# Patient Record
Sex: Female | Born: 1950
Health system: Southern US, Community
[De-identification: ages and names within clinical notes are randomized; demographics above are authoritative.]

## PROBLEM LIST (undated history)

## (undated) DIAGNOSIS — Z9981 Dependence on supplemental oxygen: Secondary | ICD-10-CM

## (undated) DIAGNOSIS — I10 Essential (primary) hypertension: Secondary | ICD-10-CM

## (undated) DIAGNOSIS — J449 Chronic obstructive pulmonary disease, unspecified: Secondary | ICD-10-CM

## (undated) DIAGNOSIS — E785 Hyperlipidemia, unspecified: Secondary | ICD-10-CM

## (undated) DIAGNOSIS — R002 Palpitations: Secondary | ICD-10-CM

## (undated) HISTORY — DX: Palpitations: R00.2

## (undated) HISTORY — PX: TONSILLECTOMY: SUR1361

## (undated) HISTORY — DX: Chronic obstructive pulmonary disease, unspecified: J44.9

## (undated) HISTORY — PX: BREAST BIOPSY: SHX20

## (undated) HISTORY — DX: Hyperlipidemia, unspecified: E78.5

## (undated) HISTORY — DX: Essential (primary) hypertension: I10

---

## 2008-01-06 ENCOUNTER — Ambulatory Visit (HOSPITAL_COMMUNITY): Admission: RE | Admit: 2008-01-06 | Discharge: 2008-01-06 | Payer: Self-pay | Admitting: Internal Medicine

## 2008-01-26 ENCOUNTER — Other Ambulatory Visit: Admission: RE | Admit: 2008-01-26 | Discharge: 2008-01-26 | Payer: Self-pay | Admitting: Obstetrics and Gynecology

## 2008-02-12 ENCOUNTER — Ambulatory Visit: Payer: Self-pay | Admitting: Gastroenterology

## 2008-02-19 ENCOUNTER — Ambulatory Visit: Payer: Self-pay | Admitting: Internal Medicine

## 2008-02-19 ENCOUNTER — Ambulatory Visit (HOSPITAL_COMMUNITY): Admission: RE | Admit: 2008-02-19 | Discharge: 2008-02-19 | Payer: Self-pay | Admitting: Internal Medicine

## 2008-10-21 ENCOUNTER — Ambulatory Visit: Payer: Self-pay | Admitting: Cardiology

## 2008-10-22 ENCOUNTER — Encounter: Payer: Self-pay | Admitting: Cardiology

## 2008-10-22 ENCOUNTER — Ambulatory Visit: Payer: Self-pay | Admitting: Cardiology

## 2008-10-22 ENCOUNTER — Ambulatory Visit (HOSPITAL_COMMUNITY): Admission: RE | Admit: 2008-10-22 | Discharge: 2008-10-22 | Payer: Self-pay | Admitting: Cardiology

## 2009-01-07 ENCOUNTER — Ambulatory Visit (HOSPITAL_COMMUNITY): Admission: RE | Admit: 2009-01-07 | Discharge: 2009-01-07 | Payer: Self-pay | Admitting: Internal Medicine

## 2009-01-11 DIAGNOSIS — E663 Overweight: Secondary | ICD-10-CM | POA: Insufficient documentation

## 2009-01-11 DIAGNOSIS — R002 Palpitations: Secondary | ICD-10-CM

## 2009-01-11 DIAGNOSIS — I10 Essential (primary) hypertension: Secondary | ICD-10-CM

## 2009-01-11 DIAGNOSIS — R9431 Abnormal electrocardiogram [ECG] [EKG]: Secondary | ICD-10-CM | POA: Insufficient documentation

## 2009-07-20 ENCOUNTER — Other Ambulatory Visit: Admission: RE | Admit: 2009-07-20 | Discharge: 2009-07-20 | Payer: Self-pay | Admitting: Internal Medicine

## 2009-11-22 ENCOUNTER — Ambulatory Visit (HOSPITAL_COMMUNITY): Admission: RE | Admit: 2009-11-22 | Discharge: 2009-11-22 | Payer: Self-pay | Admitting: Internal Medicine

## 2010-04-17 ENCOUNTER — Ambulatory Visit (HOSPITAL_COMMUNITY): Admission: RE | Admit: 2010-04-17 | Discharge: 2010-04-17 | Payer: Self-pay | Admitting: Gastroenterology

## 2010-04-27 ENCOUNTER — Ambulatory Visit (HOSPITAL_COMMUNITY): Admission: RE | Admit: 2010-04-27 | Discharge: 2010-04-27 | Payer: Self-pay | Admitting: Neurology

## 2010-10-04 ENCOUNTER — Observation Stay (HOSPITAL_COMMUNITY)
Admission: EM | Admit: 2010-10-04 | Discharge: 2010-10-06 | Disposition: A | Discharge status: Home / Self Care | Payer: Self-pay | Attending: Internal Medicine | Admitting: Internal Medicine

## 2010-10-04 ENCOUNTER — Emergency Department (HOSPITAL_COMMUNITY): Payer: Self-pay

## 2010-10-04 DIAGNOSIS — Z79899 Other long term (current) drug therapy: Secondary | ICD-10-CM | POA: Insufficient documentation

## 2010-10-04 DIAGNOSIS — R0602 Shortness of breath: Secondary | ICD-10-CM | POA: Insufficient documentation

## 2010-10-04 DIAGNOSIS — I517 Cardiomegaly: Secondary | ICD-10-CM | POA: Insufficient documentation

## 2010-10-04 DIAGNOSIS — R0789 Other chest pain: Principal | ICD-10-CM | POA: Insufficient documentation

## 2010-10-04 DIAGNOSIS — E785 Hyperlipidemia, unspecified: Secondary | ICD-10-CM | POA: Insufficient documentation

## 2010-10-04 DIAGNOSIS — J441 Chronic obstructive pulmonary disease with (acute) exacerbation: Secondary | ICD-10-CM | POA: Insufficient documentation

## 2010-10-04 LAB — DIFFERENTIAL
Basophils Absolute: 0 10*3/uL (ref 0.0–0.1)
Basophils Relative: 0 % (ref 0–1)
Eosinophils Absolute: 0.1 10*3/uL (ref 0.0–0.7)
Eosinophils Relative: 1 % (ref 0–5)
Lymphocytes Relative: 25 % (ref 12–46)
Lymphs Abs: 1.4 10*3/uL (ref 0.7–4.0)
Monocytes Absolute: 0.6 10*3/uL (ref 0.1–1.0)
Monocytes Relative: 10 % (ref 3–12)
Neutro Abs: 3.6 10*3/uL (ref 1.7–7.7)
Neutrophils Relative %: 63 % (ref 43–77)

## 2010-10-04 LAB — CBC
HCT: 38.1 % (ref 36.0–46.0)
Hemoglobin: 12.6 g/dL (ref 12.0–15.0)
MCH: 28.6 pg (ref 26.0–34.0)
MCHC: 33.1 g/dL (ref 30.0–36.0)
MCV: 86.6 fL (ref 78.0–100.0)
Platelets: 224 10*3/uL (ref 150–400)
RBC: 4.4 MIL/uL (ref 3.87–5.11)
RDW: 13.5 % (ref 11.5–15.5)
WBC: 5.6 10*3/uL (ref 4.0–10.5)

## 2010-10-04 LAB — BASIC METABOLIC PANEL
BUN: 12 mg/dL (ref 6–23)
CO2: 28 mEq/L (ref 19–32)
Calcium: 9.5 mg/dL (ref 8.4–10.5)
Chloride: 107 mEq/L (ref 96–112)
Creatinine, Ser: 0.75 mg/dL (ref 0.4–1.2)
GFR calc Af Amer: >60 mL/min (ref >60)
GFR calc non Af Amer: >60 mL/min (ref >60)
Glucose, Bld: 98 mg/dL (ref 70–99)
Potassium: 3.9 mEq/L (ref 3.5–5.1)
Sodium: 142 mEq/L (ref 135–145)

## 2010-10-04 LAB — POCT CARDIAC MARKERS
CKMB, poc: <1 ng/mL — ABNORMAL LOW (ref 1.0–8.0)
Myoglobin, poc: 64.1 ng/mL (ref 12–200)
Troponin i, poc: <0.05 ng/mL (ref 0.00–0.09)

## 2010-10-04 LAB — CARDIAC PANEL(CRET KIN+CKTOT+MB+TROPI)
CK, MB: 1.6 ng/mL (ref 0.3–4.0)
Relative Index: INVALID (ref 0.0–2.5)
Total CK: 70 U/L (ref 7–177)
Troponin I: 0.01 ng/mL (ref 0.00–0.06)

## 2010-10-04 LAB — HEPATIC FUNCTION PANEL
ALT: 21 U/L (ref 0–35)
AST: 24 U/L (ref 0–37)
Albumin: 4.2 g/dL (ref 3.5–5.2)
Alkaline Phosphatase: 78 U/L (ref 39–117)
Bilirubin, Direct: 0.1 mg/dL (ref 0.0–0.3)
Indirect Bilirubin: 0.4 mg/dL (ref 0.3–0.9)
Total Bilirubin: 0.5 mg/dL (ref 0.3–1.2)
Total Protein: 7.1 g/dL (ref 6.0–8.3)

## 2010-10-04 LAB — D-DIMER, QUANTITATIVE: D-Dimer, Quant: 0.35 ug/mL-FEU (ref 0.00–0.48)

## 2010-10-04 LAB — BRAIN NATRIURETIC PEPTIDE: Pro B Natriuretic peptide (BNP): 41.5 pg/mL (ref 0.0–100.0)

## 2010-10-05 DIAGNOSIS — R079 Chest pain, unspecified: Secondary | ICD-10-CM

## 2010-10-05 DIAGNOSIS — R072 Precordial pain: Secondary | ICD-10-CM

## 2010-10-05 LAB — CARDIAC PANEL(CRET KIN+CKTOT+MB+TROPI)
CK, MB: 1.2 ng/mL (ref 0.3–4.0)
CK, MB: 1.6 ng/mL (ref 0.3–4.0)
CK, MB: 1.3 ng/mL (ref 0.3–4.0)
CK, MB: 1.2 ng/mL (ref 0.3–4.0)
Relative Index: INVALID (ref 0.0–2.5)
Relative Index: INVALID (ref 0.0–2.5)
Relative Index: INVALID (ref 0.0–2.5)
Relative Index: INVALID (ref 0.0–2.5)
Total CK: 65 U/L (ref 7–177)
Total CK: 56 U/L (ref 7–177)
Total CK: 47 U/L (ref 7–177)
Total CK: 42 U/L (ref 7–177)
Troponin I: 0.01 ng/mL (ref 0.00–0.06)
Troponin I: 0.02 ng/mL (ref 0.00–0.06)
Troponin I: 0.02 ng/mL (ref 0.00–0.06)
Troponin I: 0.01 ng/mL (ref 0.00–0.06)

## 2010-10-05 LAB — LIPID PANEL
Cholesterol: 185 mg/dL (ref 0–200)
HDL: 59 mg/dL (ref >39)
LDL Cholesterol: 106 mg/dL — ABNORMAL HIGH (ref 0–99)
Total CHOL/HDL Ratio: 3.1 RATIO
Triglycerides: 98 mg/dL (ref <150)
VLDL: 20 mg/dL (ref 0–40)

## 2010-10-05 LAB — TSH: TSH: 0.453 u[IU]/mL (ref 0.350–4.500)

## 2010-10-06 ENCOUNTER — Observation Stay (HOSPITAL_COMMUNITY): Payer: Self-pay

## 2010-10-06 ENCOUNTER — Encounter (HOSPITAL_COMMUNITY): Payer: Self-pay | Admitting: Radiology

## 2010-10-06 DIAGNOSIS — R079 Chest pain, unspecified: Secondary | ICD-10-CM

## 2010-10-06 DIAGNOSIS — R0602 Shortness of breath: Secondary | ICD-10-CM

## 2010-10-06 LAB — CARDIAC PANEL(CRET KIN+CKTOT+MB+TROPI)
CK, MB: 1.1 ng/mL (ref 0.3–4.0)
CK, MB: 1 ng/mL (ref 0.3–4.0)
Relative Index: INVALID (ref 0.0–2.5)
Relative Index: INVALID (ref 0.0–2.5)
Total CK: 38 U/L (ref 7–177)
Troponin I: 0.01 ng/mL (ref 0.00–0.06)
Troponin I: 0.01 ng/mL (ref 0.00–0.06)

## 2010-10-06 MED ORDER — TECHNETIUM TC 99M TETROFOSMIN IV KIT
10.0000 | PACK | Freq: Once | INTRAVENOUS | Status: AC | PRN
  Administered 2010-10-06: 9.31 via INTRAVENOUS

## 2010-10-06 MED ORDER — TECHNETIUM TC 99M TETROFOSMIN IV KIT
30.0000 | PACK | Freq: Once | INTRAVENOUS | Status: AC | PRN
  Administered 2010-10-06: 30 via INTRAVENOUS

## 2010-10-08 NOTE — Discharge Summary (Signed)
Sherri Reed, ROEMER NO.:  [id number]  MEDICAL RECORD NO.:  [id number]           PATIENT TYPE:  O  LOCATION:  A201                          FACILITY:  [facility]  PHYSICIAN:  Elliot Cousin, M.D.    DATE OF BIRTH:  [birth year]  DATE OF ADMISSION:  10/04/2010 DATE OF DISCHARGE:  03/23/2012LH                              DISCHARGE SUMMARY   DISCHARGE DIAGNOSES: 1. Chest pain.  Myocardial infarction ruled out.  Stress Myoview     revealed no signs of ischemia. 2. Acute on chronic dyspnea, likely secondary to chronic obstructive     pulmonary disease with mild exacerbation. 3. Mild dyslipidemia.  The patient's total cholesterol was 185,     triglycerides 98, HDL-cholesterol 59, and LDL-cholesterol 106. 4. Mild left ventricular hypertrophy by  2-D echocardiogram. 5. Possible gastroesophageal reflux disease.  DISCHARGE MEDICATIONS: 1. Ambien 10 mg daily. 2. Acetaminophen 325 mg two tablets every 4-6 hours as needed for  pain. 3. Prilosec OTC one tablet daily. 4. Spiriva 18 mcg one inhalation daily. 5. Advair Diskus 250/50 mg one inhalation twice daily. 6. Albuterol inhaler one puff every 6 hours as needed for shortness of     breath. 7. Aspirin 81 mg daily. 8. Stop naproxen.  DISCHARGE DISPOSITION:  The patient was discharged to home in improved and stable condition on October 06, 2010.  She will follow up with the physicians at the [facility] in 1 week and with [facility]monology on October 26, 2010 at 10:30 a.m.  CONSULTATIONS:  Gerrit Friends. Dietrich Pates, MD, [facility]  PROCEDURES PERFORMED: 1. Stress Myoview study on October 06, 2010.  The results revealed low     risk dobutamine Myoview as outlined.  No diagnostic ST-segment     changes noted.  Perfusion imaging shows evidence of breast     attenuation; however, no ischemic defects.  Left ventricular     ejection fraction is normal at 62%. 2. A 2-D echocardiogram on October 05, 2010.  The results revealed left  ventricular wall thickness was in the pattern of mild LVH with     disproportionate upper septal thickening.  Systolic function was     normal.  Ejection fraction estimated to be 55-60%.  Wall motion was     normal.  There were no regional wall motion abnormalities. 3. Chest x-ray on October 04, 2010.  The results revealed no acute     findings.  Lungs are clear.  HISTORY OF PRESENTING ILLNESS:  The patient is a 60 year old woman with a past medical history significant for COPD and noncardiac chest pain, who presented to the emergency department on October 04, 2010 with a chief complaint of chest pain and  shortness of breath.  When she was initially evaluated, she was noted to be hemodynamically stable and afebrile.  Her EKG revealed normal sinus rhythm with nonspecific T-wave abnormalities and a heart rate of 64 beats per minute.  Her BNP was 41.5.  Her initial cardiac markers were within normal limits.  She was admitted for further evaluation and management.  HOSPITAL COURSE:  The patient was started on supportive treatment with as-needed morphine  for pain, as-needed Zofran for nausea, and prophylactic Protonix for possible gastroesophageal reflux disease.  She was also maintained on Advair Diskus.  Albuterol and Atrovent nebulizations were ordered for every 6 hours and then every 2 hours as needed for active COPD with mild exacerbation.  Oxygen was applied to keep her oxygen saturations greater than 90%.  For further evaluation, a number of studies were ordered including cardiac enzymes, D-dimer, TSH, 2-D echocardiogram, and a fasting lipid profile.  The patient's cardiac enzymes were all well within normal limits.  Her D-dimer was within normal limits at 0.35.  Her TSH was within normal limits at 0.453.  Her fasting lipid profile results were dictated above.  Her 2-D echocardiogram revealed preserved LV function, but with mild LVH.  There were no left ventricular regional  motion abnormalities.  Cardiologist, Dr. Dietrich Pates was consulted.  Following his evaluation, he recommended further assessment with a Myoview stress test.  The stress test was performed today.  The results revealed no evidence of reversible ischemia and preserved LV function.  The patient has a history of heartburn and indigestion.  She also takes naproxen for arthritic pain.  I instructed her to discontinue naproxen and to take Tylenol Arthritis Strength as directed and as needed for pain.  I also instructed her to take Prilosec OTC one tablet daily for possible reflux symptoms.  She voiced understanding.  She was also advised to continue Advair Diskus and albuterol inhaler as previously prescribed.  She was given Spiriva prior to hospital discharge to take home.  Per the recommendation by Dr. Dietrich Pates, an appointment was made for her to follow up with one of the pulmonologist at [facility] on October 26, 2010.  The patient was informed of this.  Prior to hospital discharge, she was oxygenating 99-100% on 2 liters of oxygen.  Oxygen was discontinued upon discharge.     Elliot Cousin, M.D.     DF/MEDQ  D:  10/06/2010  T:  10/06/2010  Job:  [id number]  cc:   Gerrit Friends. Dietrich Pates, MD, [facility] [address] [place], [place] [address]  [facility] [place]  Electronically Signed by Elliot Cousin M.D. on 10/08/2010 02:06:50 PM

## 2010-10-24 ENCOUNTER — Encounter: Payer: Self-pay | Admitting: Internal Medicine

## 2010-10-26 ENCOUNTER — Ambulatory Visit (INDEPENDENT_AMBULATORY_CARE_PROVIDER_SITE_OTHER): Payer: Self-pay | Admitting: Internal Medicine

## 2010-10-26 ENCOUNTER — Encounter: Payer: Self-pay | Admitting: Internal Medicine

## 2010-10-26 VITALS — BP 142/80 | HR 63 | Temp 97.9°F | Ht <= 58 in | Wt 187.4 lb

## 2010-10-26 DIAGNOSIS — J449 Chronic obstructive pulmonary disease, unspecified: Secondary | ICD-10-CM | POA: Insufficient documentation

## 2010-10-26 MED ORDER — BUDESONIDE-FORMOTEROL FUMARATE 160-4.5 MCG/ACT IN AERO: 2.0000 | INHALATION_SPRAY | Freq: Two times a day (BID) | RESPIRATORY_TRACT | Status: DC

## 2010-10-26 NOTE — Patient Instructions (Signed)
Symbicort Take 2 puffs first thing in am and then another 2 puffs about 12 hours later and stop advair.  Follow the symbicort each with the spiriva including the day you return  GERD (REFLUX)  is an extremely common cause of respiratory symptoms, many times with no significant heartburn at all.    It can be treated with medication, but also with lifestyle changes including avoidance of late meals, excessive alcohol, smoking cessation, and avoid fatty foods, chocolate, peppermint, colas, red wine, and acidic juices such as orange juice.  NO MINT OR MENTHOL PRODUCTS SO NO COUGH DROPS  USE SUGARLESS CANDY INSTEAD (jolley ranchers or Stover's)  NO OIL BASED VITAMINS   Please schedule a follow up office visit in 4 weeks, sooner if needed with PFT's

## 2010-10-26 NOTE — Progress Notes (Signed)
  Subjective:    Patient ID: Sherri Reed, female    DOB: 10-03-1950, 60 y.o.   MRN: [id number]  HPI  23 yowf quit smoking 2007 p hosp at [facility]  with pna @ wt 140 with doe that has gradually worsened since then and referred by Dr Elliot Cousin p another hosp in March 2012 for sob and cp.   10/26/2010  Initial pulmonary office eval cc doe x room to room and totally exhausted walking to mailbox and back feels proventil works the best assoc with dry cough day > night.    Pt denies any significant sore throat, dysphagia, itching, sneezing,  nasal congestion or excess/ purulent secretions,  fever, chills, sweats, unintended wt loss (gaining wt), pleuritic or exertional cp, hempoptysis, orthopnea pnd .  Also denies any obvious fluctuation of symptoms with weather or environmental changes or other aggravating or alleviating factors.     Review of Systems  Constitutional: Positive for unexpected weight change. Negative for fever and chills.  HENT: Negative for ear pain, nosebleeds, congestion, sore throat, rhinorrhea, sneezing, trouble swallowing, dental problem, voice change, postnasal drip and sinus pressure.   Eyes: Negative for visual disturbance.  Respiratory: Positive for shortness of breath. Negative for cough and choking.   Cardiovascular: Positive for palpitations and leg swelling. Negative for chest pain.  Gastrointestinal: Negative for vomiting, abdominal pain and diarrhea.  Genitourinary: Negative for difficulty urinating.  Musculoskeletal: Positive for arthralgias.  Skin: Negative for rash.  Neurological: Positive for headaches. Negative for tremors and syncope.  Hematological: Does not bruise/bleed easily.       Objective:   Physical Exam    hoarse wf nad wt 10/26/2010 187 with classic pseudowheeze better with plm HEENT mild turbinate edema.  Oropharynx no thrush or excess pnd or cobblestoning.  No JVD or cervical adenopathy. Mild accessory muscle hypertrophy. Trachea  midline, nl thryroid. Chest was hyperinflated by percussion with diminished breath sounds and moderate increased exp time without wheeze. Hoover sign positive at mid inspiration. Regular rate and rhythm without murmur gallop or rub or increase P2 or edema.  Abd: no hsm, nl excursion. Ext warm without cyanosis or clubbing.     cxr 10/04/10 ok Assessment & Plan:

## 2010-10-27 ENCOUNTER — Encounter: Payer: Self-pay | Admitting: Internal Medicine

## 2010-10-27 NOTE — Assessment & Plan Note (Addendum)
  When respiratory symptoms begin well after a patient reports complete smoking cessation,  it is very hard to "blame" COPD specifically or airways disorders in general  ie it doesn't make any more sense than hearing a  [employer] driver wrecked his car while driving his kids to school or a surgeon sliced his hand off carving roast beef (it must be rare indeed!)     That is to say, once the high risk activity stops,  the symptoms should not suddenly erupt or markedly worsen.  If so, the differential diagnosis should include  Obesity/deconditioning (clearly part of the problem here),  LPR/Reflux/Aspiration syndromes (? Contributing to cp/ atypical symptoms),  occult CHF, or  especially side effect of medications commonly used in this population esp ace inhibitors and dry powdered inhalers  Try off advair and on symbicort since she feels proaire helps the most  The proper method of use, as well as anticipated side effects, of this metered-dose inhaler are discussed and demonstrated to the patient. Improved to 50% with coaching but no better and needs work vs change to neb depending on results of pft's and whether she can achieve > 75% with repeated instructions  Try rx for GERD with diet

## 2010-11-01 NOTE — H&P (Signed)
[name], Sherri Reed              ACCOUNT NO.:  [id number]  MEDICAL RECORD NO.:  [id number]           PATIENT TYPE:  O  LOCATION:  A201                          FACILITY:  [facility]  PHYSICIAN:  Sutton Hirsch L. Lendell Caprice, MDDATE OF BIRTH:  August 05, 1950  DATE OF ADMISSION:  10/04/2010 DATE OF DISCHARGE:                             HISTORY & PHYSICAL   CHIEF COMPLAINT:  Chest pain.  HISTORY OF PRESENT ILLNESS:  [name] is a pleasant 60 year old white female who presents with substernal chest pressure.  She has been much more dyspneic on exertion recently.  She has a history of COPD which apparently is severe based on last pulmonary function tests in the computer which was in October 2011.  She came to the emergency room because she had sudden onset of chest pain while taking a shower.  It was so severe that she called out to her daughter for help.  She felt a rush into her neck and face.  She felt flushed.  It did not feel anything like a COPD exacerbation.  She also reports that she has had foot swelling.  She reports that she felt like she was being smothered and she was choking.  She was seen by Dr. Daleen Squibb in 2010 for palpitations. She has never had a stress test.  She quit smoking several years ago but was a previous heavy smoker.  Her mother had an MI in her early 16s. Her mother also has a history of pulmonary embolus, DVT and stroke.  PAST MEDICAL HISTORY:  As above, also hypertension.  She had an allergic reaction to LISINOPRIL several months ago and has not been placed back on an antihypertensive.  Osteoarthritis of the knees and back.  MEDICATIONS:  Advair, Naprosyn, aspirin, albuterol as needed.  ALLERGIES:  Lisinopril which reportedly caused left facial swelling.  FAMILY HISTORY:  As above.  PAST SURGICAL HISTORY:  Hysterectomy, partial, tonsillectomy.  REVIEW OF SYSTEMS:  Systems reviewed and as above, otherwise negative.  PHYSICAL EXAMINATION:  VITAL SIGNS:   Temperature is 97.8, blood pressure 156/81, pulse 64, respiratory rate 20, oxygen saturation 98% on room air. GENERAL:  The patient is an obese white female in no acute distress. HEENT:  Pupils are equal, round, and reactive to light.  Sclerae nonicteric.  Moist mucous membranes.  Oropharynx is without erythema or exudate. NECK:  Thick, supple.  No lymphadenopathy.  No carotid bruits. LUNGS:  Diminished throughout without wheezes, rhonchi or rales. CARDIOVASCULAR:  Regular rate and rhythm without murmurs, gallops or rubs.  No chest wall tenderness. ABDOMEN:  Obese, soft, nontender, nondistended. GU:  Deferred. RECTAL:  Deferred. EXTREMITIES:  No clubbing, cyanosis or edema. SKIN:  No rash. PSYCHIATRIC: Normal affect. NEUROLOGIC:  Alert and oriented.  Cranial nerves and sensorimotor exam are intact.  LABORATORY FINDINGS:  CBC normal, complete metabolic panel normal, D- dimer normal, BNP 41, cardiac markers negative.  EKG shows a normal sinus rhythm with nonspecific changes which are unchanged from previous.  ASSESSMENT AND PLAN: 1. Chest pain:  The patient will be admitted to the hospital.  I will     check serial cardiac enzymes.  Consult Cardiology to consider     stress test, order an echocardiogram.  She has been dyspneic on     exertion and has had swelling but her BNP is normal which is     reassuring.  She has no rales on physical exam and her chest x-ray     shows no heart failure. 2. Chronic obstructive pulmonary disease.  Continue outpatient     medications and give albuterol nebulizers as needed. 3. Hypertension, not currently treated.  I will monitor this and start     an antihypertensive if her blood pressure significantly elevated. 4. Obesity.     [name] L. Lendell Caprice, MD     CLS/MEDQ  D:  10/05/2010  T:  10/06/2010  Job:  [id number]  Electronically Signed by Crista Curb MD on 11/01/2010 09:34:02 PM

## 2010-11-18 NOTE — Consult Note (Signed)
Sherri Reed, Sherri Reed              ACCOUNT NO.:  [id number]  MEDICAL RECORD NO.:  [id number]           PATIENT TYPE:  O  LOCATION:  A201                          FACILITY:  [facility]  PHYSICIAN:  Gerrit Friends. Dietrich Pates, MD, FACCDATE OF BIRTH:  1950/12/25  DATE OF CONSULTATION:  10/05/2010 DATE OF DISCHARGE:                                CONSULTATION   PRIMARY CARDIOLOGIST:  Jesse Sans. Daleen Squibb, MD, [facility]  PRIMARY CARE PHYSICIAN:  Madelin Rear. Sherwood Gambler, MD and the [facility] physicians.  REASON FOR CONSULTATION:  Chest pain.  HISTORY OF PRESENT ILLNESS:  This is a 60 year old Caucasian female with known history of longstanding COPD with increasing dyspnea on exertion over 1 week with facial swelling and lower extremity edema.  She states that the day of admission, she was taking a shower, was unable to breathe, had a breathing treatment at home which belonged to her mother. She felt better and made appointment at the [facility] for evaluation. She did see them that day and they referred her to the emergency room as she was having some lower extremity edema.  In the emergency room, the patient was found to have a blood pressure of 156/81.  She was given Solu-Medrol and Atrovent breathing treatment.  There was no evidence of extreme lower extremity edema on evaluation in the ER.  The patient was admitted for further evaluation and treatment.  We are asked to see the patient in the setting of chest pressure.  She had been seen by Dr. Daleen Squibb in April 2010, at which time, he ordered an echocardiogram which was found to be normal.  There was no stress tests ordered at that time.  The patient is currently feeling much better, but is still having some issues with breathing.  She is currently being followed and respiratory evaluation is in process.  Positive for sudden onset of shortness of breath with dyspnea on exertion and edema and chest pressure, no dizziness, nausea, vomiting or diaphoresis  associated.  All other systems are reviewed and are found to be negative.  PAST MEDICAL HISTORY: 1. Noncardiac chest pain. 2. Palpitations. 3. Hypertension. 4. Obesity. 5. COPD. 6. Remote history of heavy tobacco abuse. 7. PFTs in 2011 positive for COPD. 8. Most recent echocardiogram April 2010 with an EF of 50-55% with no     wall motion abnormalities.  PAST SURGICAL HISTORY:  C-section and tonsillectomy.  SOCIAL HISTORY:  She lives in [place] with her family.  Apparently, both parents and children and ex-husband all live with her.  She is not employed.  She is divorced.  She has a history of 60 pack years, but quit 5 years ago.  Negative for EtOH or drug use.  FAMILY HISTORY:  Mother with a CVA, MI and coronary artery bypass grafting.  Father with CVA.  CURRENT MEDICATIONS PRIOR TO ADMISSION:  Advair, Naprosyn and albuterol inhaler.  ALLERGIES:  No known drug allergies.  CURRENT LABORATORY DATA:  Sodium 142, potassium 3.9, chloride 107, CO2 28, BUN 12, creatinine 0.75, glucose 98.  Hemoglobin 12.6, hematocrit 38.1, white blood cells 5.6, platelets 224.  Troponin 0.01, 0.01 and 0.02  respectively, D-dimer 0.35, BNP 41.5.  EKG revealing normal sinus rhythm with a rate of 64 beats per minute with nonspecific T-wave abnormalities with flattening in leads III and aVF and anterior septally.  CHEST X-RAY:  No acute findings.  PHYSICAL EXAMINATION:  VITAL SIGNS:  Blood pressure 120/70, pulse 76, respirations 20, temperature 98.6, O2 sats 96% on 2 L, her weight is 85.6 kg.  GENERAL:  She is awake, alert and oriented in no acute distress. HEENT:  Head is normocephalic and atraumatic.  Eyes:  PERRLA. NECK:  Supple with no JVD or carotid bruits appreciated. CARDIOVASCULAR:  Regular rate and rhythm without murmurs, rubs or gallops.  Pulses are 2+ and equal without bruits. LUNGS:  Bibasilar crackles are noted, otherwise clear. ABDOMEN:  Soft, nontender with 2+ bowel  sounds. EXTREMITIES:  Without clubbing, cyanosis or edema. MUSCULOSKELETAL:  No joint deformity or effusions. NEURO:  Cranial nerves II through XII are grossly intact. PSYCH:  She has good affect and is responding appropriately.  IMPRESSION:  Acute dyspnea, probable chronic obstructive pulmonary disease exacerbation, chest pressure, most likely related to this.  She did complain of edema in her face and lower extremities, but it apparently resolved prior to being seen in the ER with questionable component of congestive heart failure, but there is no evidence for chest x-ray or clinical assessment or labs.  We will repeat echocardiogram for comparison to 2010.  Nonspecific T- wave abnormalities are noted on her EKG, but this was also mentioned in Dr. Vern Claude office note in April 2010, stating that she had some T-wave inversion V1, V2 and V3.  The patient will also have a dobutamine stress Myoview secondary to risk factors of hypertension to heavy tobacco abuse and family history.  We will check lipid status and follow making further recommendations throughout hospital course depending upon response to treatment.  On behalf of the physicians and providers of [facility], we would like to thank the physicians at the [facility] for allowing [place] to participate in the care of this patient.     Bettey Mare. Lyman Bishop, NP   ______________________________ Gerrit Friends. Dietrich Pates, MD, [facility]    KML/MEDQ  D:  10/05/2010  T:  10/05/2010  Job:  [id number]  Electronically Signed by Joni Reining NP on 10/05/2010 01:29:25 PM Electronically Signed by [place] Bing MD [facility] on 11/18/2010 10:15:52 PM

## 2010-11-28 NOTE — Assessment & Plan Note (Signed)
[facility]                       [place] CARDIOLOGY OFFICE NOTE   Reed, Sherri                     MRN:          [id number]  DATE:10/21/2008                            DOB:          June 25, 1951    I was asked by Dr. Artis Delay and the staff at [facility] to consult on [place] with an abnormal EKG and  some palpitations.   HISTORY OF PRESENT ILLNESS:  Mrs. Sherri Reed is a very pleasant 60 year old  white female who has followed at the [facility].  She had an EKG, which  showed some T-wave inversion in V1 and V2, as well as some flattening  with biphasic T-wave in V3.   Looking back at EKGs from Dr. Bartholomew Crews office and also from [facility], which we requested today, she also at that time had T-  wave inversion in V1 and V2, as well as V3.   At that time, she was admitted with atypical chest pain.  Cardiac  markers were negative.  Chest x-ray showed no acute cardiopulmonary  disease.  CT scan of the chest ruled out pulmonary embolus or any other  intrathoracic pathology.   She really does not have chest pain now, more so palpitations.  These  occur at night when she is lying down mostly.  Sometimes they happen  during the day at rest.  She has not had any syncope or presyncope.  They last just a few seconds.  There are no other symptoms other than  she gets extremely anxious.   Past medical history of cardiac risk factors are important for heavy  tobacco use in the past, so she quit 3 years ago, hypertension, obesity,  and some mild hyperlipidemia, though her HDL is quite high in the 70s  and 80s on several reports.   Her other medical problems include COPD and history of pneumonia.  She  has had a colonoscopy in the past.   ALLERGIES:  She has no known drug allergies.   CURRENT MEDICATIONS:  1. Lisinopril 10 mg per day.  2. Advair 250/50 b.i.d.  3. Proventil inhaler, which is  Ventolin or albuterol q.6 h p.r.n.  4. Tums p.r.n.  5. Naproxen 500 b.i.d.   She does not smoke now nor does she drink or use any drugs.   SOCIAL HISTORY:  She works at [employer].  She is separated.  She has two children.   FAMILY HISTORY:  She says her mother has heart issues, but she is alive.   REVIEW OF SYSTEMS:  She wears glasses.  She has top and bottom dentures.  She has a history of gastroesophageal reflux.  She has always struggled  with her weight, particularly last recently.  She has no claudication.  No significant edema, but she does have some varicose veins.  She has no  allergies or history of asthma, though she does wheeze probably from a  chronic lung disease.  She complains of arthritis all over.   PHYSICAL EXAMINATION:  VITAL SIGNS:  Her blood pressure is 130/68 in the  left arm.  Her  pulse is 89 and regular.  EKG was repeated, which is  consistent with her previous EKGs.  She is 5 feet 4 inches, weighs 184  pounds.  She is in no acute distress, a bit anxious, respiratory rate is  18.  She has some audible wheezing and cough frequently.  HEENT:  Upper and lower dentures, otherwise normal.  NECK:  Supple.  Carotid upstrokes were equal bilaterally without bruits.  There is no JVD.  Thyroid is not enlarged.  Trachea is midline.  LUNGS:  Clear except for some mild expiratory wheezing up high in both  lung fields.  PMI is poorly appreciated.  CARDIAC:  She has normal S1 and S2.  No murmur, rub, or gallop.  ABDOMINAL:  Soft, good bowel sounds.  No midline bruit.  No  hepatomegaly.  EXTREMITIES:  No cyanosis, clubbing, or edema.  Pulses are present.  NEURO:  Intact.   EKG again shows sinus rhythm with RSR prime and slight T-wave inversion  in V1 and V2.  T-wave inversion in V3, biphasic in V4.   She also has poor R-wave progression across the anterior precordium.   ASSESSMENT/PLAN:  1. Palpitations mostly at rest, most likely benign.  2. Abnormal EKG,  which has been consistently present since March 2009.      At that time she was ruled out for myocardial infarction, as well      as pulmonary embolus.  3. Hypertension.  4. Obesity.  5. Significant lung disease from heavy smoking in the past.   Her palpitations could be made worse by the Proventil.  I have told her  to use as little as possible.   We will obtain a 2-D echocardiogram to rule out any significant  structural heart disease.  It this is normal, reassurance will be given.    Thomas C. Daleen Squibb, MD, [facility]  Electronically Signed   TCW/MedQ  DD: 10/21/2008  DT: 10/22/2008  Job #: [id number]   cc:   [facility]

## 2010-11-28 NOTE — Op Note (Signed)
NAME:  Sherri Reed, Sherri Reed              ACCOUNT NO.:  [id number]   MEDICAL RECORD NO.:  [id number]          PATIENT TYPE:  AMB   LOCATION:  DAY                           FACILITY:  [facility]   PHYSICIAN:  R. Roetta Sessions, M.D. DATE OF BIRTH:  [birth year]   DATE OF PROCEDURE:  02/19/2008  DATE OF DISCHARGE:                               OPERATIVE REPORT   PROCEDURE:  Diagnostic colonoscopy.   INDICATIONS FOR PROCEDURE:  A 60 year old lady with intermittent  constipation and intermittent blood per rectum recently.  She really had  a colonoscopy some 4 years ago up in [place] and she tells a small polyp  was found.  There is no family history of colon cancer.  Colonoscopy is  now being done.  Risks, benefits, and alternatives have been reviewed  and questions answered.  She is agreeable.   PROCEDURE NOTE:  O2 saturation, blood pressure, pulse and respiration  were monitored throughout the entire procedure.   CONSCIOUS SEDATION:  Versed 6 mg IV and Demerol 100 g IV in divided  doses.   INSTRUMENT:  Pentax video chip system.   FINDINGS:  Digital rectal exam revealed no abnormalities.  Endoscopic  findings:  The prep was adequate, but relatively poor on the right side.  Colon:  Colonic mucosa was surveyed from the rectosigmoid junction  through the left, transverse, right colon, appendiceal orifice,  ileocecal valve, and cecum.  These structures were well seen and  photographed for the record.  From this level, scope was slowly and  cautiously withdrawn.  All previously mentioned mucosal surfaces were  again seen.  The colonic mucosa appeared normal.  I had to wash quite a  bit and lavage the ascending segment to see the mucosa.   Very small polyp or other lesion may have been obscured by relatively  poor prep.  Scope was pulled down to the rectum where thorough  examination of the rectal mucosa including retroflexed view of the anal  verge, en face view of the anal canal demonstrated some  minimally  friable anal canal hemorrhoids.  The patient tolerated procedure well  and was reactive to endoscopy.   IMPRESSION:  1. Minimally friable anal canal hemorrhoids, otherwise, normal rectum.  2. Grossly normal colon, relatively poor prep on the right side made      the exam more  difficult.   RECOMMENDATIONS:  1. Constipation and hemorrhoid literature provided to Ms. Caprio.  2. She is to go on a high-fibre diet with taking a supplement such as      Benefiber 1 tablespoon daily.  3. Colace stool softener 100 mg orally twice daily.  4. MiraLax 17 g orally at bedtime p.r.n. constipation.  5. A 10-day course of Anusol-HC Suppository one per rectum at bedtime.  6. She is to let us know if she has any future bowel problems.   I have no information on the polyp previously removed, but given a prior  history of colonic polyps, we recommend she return in 5 years for a  followup colonoscopy.      Jonathon Bellows, M.D.  Electronically Signed  RMR/MEDQ  D:  02/19/2008  T:  02/20/2008  Job:  [id number]

## 2010-11-28 NOTE — Consult Note (Signed)
NAME:  KHRISTY, KALAN              ACCOUNT NO.:  [id number]   MEDICAL RECORD NO.:  [id number]          PATIENT TYPE:  AMB   LOCATION:  DAY                           FACILITY:  [facility]   PHYSICIAN:  R. Roetta Sessions, M.D. DATE OF BIRTH:  January 11, 1951   DATE OF CONSULTATION:  02/12/2008  DATE OF DISCHARGE:                                 CONSULTATION   REASON FOR CONSULTATION:  Hematochezia, heme-positive stools.   [facility]   HISTORY OF PRESENT ILLNESS:  The patient is a 60 year old Caucasian  female with history of COPD who presents for further evaluation of  recurring hematochezia.  She states she has had this off and on over the  past couple of months.  She has had several episodes of what she  describes as large-volume hematochezia.  She states she gets the urge to  pass gas and a little bit of gross blood comes out.  If she has a bowel  movement, even more blood comes out.  She has days that is much more  than she has ever experienced before, and she is scared.  She does have  a bowel movement about everyday.  Sometimes, the stool is hard.  Denies  any diarrhea, abdominal pain, rectal pain, nausea, vomiting, heartburn,  dysphagia, or odynophagia.  Denies any unintentional weight loss.  She  recently went to [facility] and had a GYN exam.  She was heme-  positive on rectal exam.  She was noted to have some skin tags present.  The patient states she had a colonoscopy about 4 years ago and had  polyps.  This was done by Dr. Linna Darner in [place].   CURRENT MEDICATIONS:  Advair daily, Proventil p.r.n., and Tylenol p.r.n.   ALLERGIES:  No known drug allergies.   PAST MEDICAL HISTORY:  COPD/bronchitis.   PAST SURGICAL HISTORY:  Cesarean section, colonoscopy as above.   FAMILY HISTORY:  Mother is 48, has COPD and heart disease.  Father, she  does not know anything about.  No family history of colon cancer to her  knowledge.   SOCIAL HISTORY:  She is separated.   She has 2 children.  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  She quit smoking 2 years ago.  No alcohol use.   REVIEW OF SYSTEMS:  See HPI for GI and constitutional.  Cardiopulmonary,  denies chest pain.  She states she has baseline dyspnea on exertion.  Genitourinary, no dysuria or hematuria.   PHYSICAL EXAM:  VITAL SIGNS:  Weight 178.5, height 5 feet 4 inches, temp  98.7, blood pressure 120/70, and pulse 88.  GENERAL:  Pleasant, obese Caucasian female in no acute distress.  SKIN:  Warm and dry.  No jaundice.  HEENT:  Sclerae nonicteric.  Oropharyngeal mucosa moist and pink.  No  lesions, erythema, or exudate.  No lymphadenopathy or thyromegaly.  CHEST:  Lungs are clear to auscultation.  CARDIAC:  Regular rate and rhythm.  Normal S1, S2.  No murmurs, rubs, or  gallops.  ABDOMEN:  Positive bowel sounds.  Abdomen is soft, nontender, and  nondistended.  No organomegaly or masses.  No rebound.  No guarding.  No  abdominal bruits or hernia.  LOWER EXTREMITIES:  No edema.   IMPRESSION:  Ms. Tillis is a 60 year old lady who has had recent large  volume hematochezia.  She had hemoccult positive stool on rectal exam  recently.  She has very mild constipation, but has regular bowel  movements.  Stools were somewhat hard.  She gives a history of colon  polyps on colonoscopy about 4 years ago.  We are trying to retrieve  those records.   PLAN:  1. Colonoscopy with Dr. Jena Gauss in the near future.  2. Review colonoscopy records when available.  3. Advised her to use stool softener daily.   [facility] to [facility] for allowing [place] to take part in the care  of this patient.      Tana Coast, P.AJonathon Bellows, M.D.  Electronically Signed    LL/MEDQ  D:  02/12/2008  T:  02/13/2008  Job:  [id number]   cc:   [facility]

## 2010-11-29 ENCOUNTER — Ambulatory Visit (INDEPENDENT_AMBULATORY_CARE_PROVIDER_SITE_OTHER): Payer: Self-pay | Admitting: Internal Medicine

## 2010-11-29 ENCOUNTER — Encounter: Payer: Self-pay | Admitting: Internal Medicine

## 2010-11-29 DIAGNOSIS — J449 Chronic obstructive pulmonary disease, unspecified: Secondary | ICD-10-CM

## 2010-11-29 LAB — PULMONARY FUNCTION TEST

## 2010-11-29 NOTE — Progress Notes (Signed)
   Subjective:    Patient ID: Sherri Reed, female    DOB: Jun 26, 1951, 60 y.o.   MRN: [id number]  HPI  Primary = [facility]  59 yowf quit smoking 2007 p hosp at [facility] when  with pna @ wt 140 with doe that has gradually worsened since then and referred by Dr Elliot Cousin p another hosp [facility] March 2012 for sob and cp.   10/26/2010  Initial pulmonary office eval on spiriva cc doe x room to room x years  and totally exhausted walking to mailbox and back feels proventil works the best assoc with dry cough day > night.   Add Symbicort Take 2 puffs first thing in am and then another 2 puffs about 12 hours later and stop advair.  Follow the symbicort each with the spiriva including the day you return  GERD (REFLUX) diet  11/29/2010 ov/Sherri Reed sob better, no cough. Pt denies any significant sore throat, dysphagia, itching, sneezing,  nasal congestion or excess/ purulent secretions,  fever, chills, sweats, unintended wt loss, pleuritic or exertional cp, hempoptysis, orthopnea pnd or leg swelling.    Also denies any obvious fluctuation of symptoms with weather or environmental changes or other aggravating or alleviating factors.     PMHx COPD     - PFT's 11/29/2010  FEV1  1.13 (47%) ratio 38  No better p B2   DLC0 56 > 87%    - HFA 90% with hfa and dpi             Objective:   Physical Exam    hoarse wf nad wt 10/26/2010 187  >   187 11/29/2010  HEENT mild turbinate edema.  Oropharynx no thrush or excess pnd or cobblestoning.  No JVD or cervical adenopathy. Mild accessory muscle hypertrophy. Trachea midline, nl thryroid. Chest was hyperinflated by percussion with diminished breath sounds and moderate increased exp time without wheeze. Hoover sign positive at mid inspiration. Regular rate and rhythm without murmur gallop or rub or increase P2 or edema.  Abd: no hsm, nl excursion. Ext warm without cyanosis or clubbing.     cxr 10/04/10 ok Assessment & Plan:

## 2010-11-29 NOTE — Assessment & Plan Note (Signed)
   Each maintenance medication was reviewed in detail including most importantly the difference between maintenance and as needed and under what circumstances the prns are to be used.  Please see instructions for details which were reviewed in writing and the patient given a copy.    F/u can be prn

## 2010-11-29 NOTE — Progress Notes (Signed)
PFT done today. 

## 2010-11-29 NOTE — Patient Instructions (Signed)
GERD (REFLUX)  is an extremely common cause of respiratory symptoms, many times with no significant heartburn at all.    It can be treated with medication, but also with lifestyle changes including avoidance of late meals, excessive alcohol, smoking cessation, and avoid fatty foods, chocolate, peppermint, colas, red wine, and acidic juices such as orange juice.  NO MINT OR MENTHOL PRODUCTS SO NO COUGH DROPS  USE SUGARLESS CANDY INSTEAD (jolley ranchers or Stover's)  NO OIL BASED VITAMINS

## 2010-12-05 ENCOUNTER — Encounter: Payer: Self-pay | Admitting: Internal Medicine

## 2011-02-26 ENCOUNTER — Telehealth: Payer: Self-pay | Admitting: Internal Medicine

## 2011-02-26 NOTE — Telephone Encounter (Signed)
Pt calling for results of PFT, same has been scanned into pt's chart, Dr. Sherene Sires please advise. Thanks.

## 2011-02-27 NOTE — Telephone Encounter (Signed)
Call:  Mod severe airflow obstruction,  Need to discuss the specific details at next ov but not change in meantime in rx

## 2011-02-27 NOTE — Telephone Encounter (Signed)
Pt is aware of PFT results per Dr. Sherene Sires and verbalized understanding. She does not have a f/u scheduled and did not wish to schedule anything at this time. She will call back.

## 2011-04-24 ENCOUNTER — Ambulatory Visit (HOSPITAL_COMMUNITY)
Admission: RE | Admit: 2011-04-24 | Discharge: 2011-04-24 | Disposition: A | Discharge status: Home / Self Care | Payer: Self-pay | Source: Ambulatory Visit | Attending: Physician Assistant | Admitting: Physician Assistant

## 2011-04-24 ENCOUNTER — Other Ambulatory Visit (HOSPITAL_COMMUNITY): Payer: Self-pay | Admitting: Physician Assistant

## 2011-04-24 DIAGNOSIS — R109 Unspecified abdominal pain: Secondary | ICD-10-CM | POA: Insufficient documentation

## 2011-04-24 DIAGNOSIS — K7689 Other specified diseases of liver: Secondary | ICD-10-CM | POA: Insufficient documentation

## 2011-09-26 ENCOUNTER — Other Ambulatory Visit (HOSPITAL_COMMUNITY): Payer: Self-pay | Admitting: Physician Assistant

## 2011-10-12 ENCOUNTER — Other Ambulatory Visit (HOSPITAL_COMMUNITY): Payer: Self-pay | Admitting: Physician Assistant

## 2011-10-12 DIAGNOSIS — Z1231 Encounter for screening mammogram for malignant neoplasm of breast: Secondary | ICD-10-CM

## 2011-10-15 ENCOUNTER — Ambulatory Visit (HOSPITAL_COMMUNITY)
Admission: RE | Admit: 2011-10-15 | Discharge: 2011-10-15 | Disposition: A | Discharge status: Home / Self Care | Payer: Self-pay | Source: Ambulatory Visit | Attending: Physician Assistant | Admitting: Physician Assistant

## 2011-10-15 DIAGNOSIS — Z1231 Encounter for screening mammogram for malignant neoplasm of breast: Secondary | ICD-10-CM

## 2011-12-25 ENCOUNTER — Telehealth: Payer: Self-pay | Admitting: Internal Medicine

## 2011-12-25 NOTE — Telephone Encounter (Signed)
Called, spoke with pt.  States she is trying to sign up for disability and requesting we fax a copy of her PFTs to the [facility] in [place] so she can pick this up there..  I advised pt this would need to go through Medical Records and asked she call them at [phone].  She verbalized understanding of this.  Nothing further needed at this time.

## 2012-11-25 ENCOUNTER — Other Ambulatory Visit (HOSPITAL_COMMUNITY): Payer: Self-pay | Admitting: Internal Medicine

## 2012-11-25 DIAGNOSIS — Z139 Encounter for screening, unspecified: Secondary | ICD-10-CM

## 2012-11-28 ENCOUNTER — Ambulatory Visit (HOSPITAL_COMMUNITY)
Admission: RE | Admit: 2012-11-28 | Discharge: 2012-11-28 | Disposition: A | Discharge status: Home / Self Care | Payer: Medicaid Other | Source: Ambulatory Visit | Attending: Internal Medicine | Admitting: Internal Medicine

## 2012-11-28 DIAGNOSIS — Z1231 Encounter for screening mammogram for malignant neoplasm of breast: Secondary | ICD-10-CM | POA: Insufficient documentation

## 2012-11-28 DIAGNOSIS — Z139 Encounter for screening, unspecified: Secondary | ICD-10-CM

## 2013-01-21 DIAGNOSIS — R079 Chest pain, unspecified: Secondary | ICD-10-CM

## 2013-02-09 ENCOUNTER — Encounter (HOSPITAL_COMMUNITY): Payer: Self-pay | Admitting: *Deleted

## 2013-02-09 ENCOUNTER — Emergency Department (HOSPITAL_COMMUNITY)
Admission: EM | Admit: 2013-02-09 | Discharge: 2013-02-09 | Disposition: A | Discharge status: Home / Self Care | Payer: Medicaid Other | Attending: Emergency Medicine | Admitting: Emergency Medicine

## 2013-02-09 ENCOUNTER — Emergency Department (HOSPITAL_COMMUNITY): Payer: Medicaid Other

## 2013-02-09 DIAGNOSIS — Y9389 Activity, other specified: Secondary | ICD-10-CM | POA: Insufficient documentation

## 2013-02-09 DIAGNOSIS — R9431 Abnormal electrocardiogram [ECG] [EKG]: Secondary | ICD-10-CM | POA: Insufficient documentation

## 2013-02-09 DIAGNOSIS — R002 Palpitations: Secondary | ICD-10-CM | POA: Insufficient documentation

## 2013-02-09 DIAGNOSIS — I10 Essential (primary) hypertension: Secondary | ICD-10-CM | POA: Insufficient documentation

## 2013-02-09 DIAGNOSIS — Z87891 Personal history of nicotine dependence: Secondary | ICD-10-CM | POA: Insufficient documentation

## 2013-02-09 DIAGNOSIS — Z7982 Long term (current) use of aspirin: Secondary | ICD-10-CM | POA: Insufficient documentation

## 2013-02-09 DIAGNOSIS — Z79899 Other long term (current) drug therapy: Secondary | ICD-10-CM | POA: Insufficient documentation

## 2013-02-09 DIAGNOSIS — Y929 Unspecified place or not applicable: Secondary | ICD-10-CM | POA: Insufficient documentation

## 2013-02-09 DIAGNOSIS — S93401A Sprain of unspecified ligament of right ankle, initial encounter: Secondary | ICD-10-CM

## 2013-02-09 DIAGNOSIS — E663 Overweight: Secondary | ICD-10-CM | POA: Insufficient documentation

## 2013-02-09 DIAGNOSIS — X500XXA Overexertion from strenuous movement or load, initial encounter: Secondary | ICD-10-CM | POA: Insufficient documentation

## 2013-02-09 DIAGNOSIS — S93409A Sprain of unspecified ligament of unspecified ankle, initial encounter: Secondary | ICD-10-CM | POA: Insufficient documentation

## 2013-02-09 MED ORDER — OXYCODONE-ACETAMINOPHEN 5-325 MG PO TABS: 1.0000 | ORAL_TABLET | Freq: Four times a day (QID) | ORAL | Status: DC | PRN

## 2013-02-09 MED ORDER — IBUPROFEN 800 MG PO TABS
800.0000 mg | ORAL_TABLET | Freq: Once | ORAL | Status: AC
  Administered 2013-02-09: 800 mg via ORAL
  Filled 2013-02-09: qty 1

## 2013-02-09 MED ORDER — IPRATROPIUM BROMIDE 0.02 % IN SOLN
0.5000 mg | Freq: Once | RESPIRATORY_TRACT | Status: AC
  Administered 2013-02-09: 0.5 mg via RESPIRATORY_TRACT
  Filled 2013-02-09: qty 2.5

## 2013-02-09 MED ORDER — ALBUTEROL SULFATE (5 MG/ML) 0.5% IN NEBU
5.0000 mg | INHALATION_SOLUTION | Freq: Once | RESPIRATORY_TRACT | Status: AC
  Administered 2013-02-09: 5 mg via RESPIRATORY_TRACT
  Filled 2013-02-09: qty 1

## 2013-02-09 MED ORDER — OXYCODONE-ACETAMINOPHEN 5-325 MG PO TABS
2.0000 | ORAL_TABLET | Freq: Once | ORAL | Status: AC
  Administered 2013-02-09: 2 via ORAL
  Filled 2013-02-09: qty 2

## 2013-02-09 MED ORDER — IBUPROFEN 600 MG PO TABS: 600.0000 mg | ORAL_TABLET | Freq: Four times a day (QID) | ORAL | Status: DC | PRN

## 2013-02-09 NOTE — ED Notes (Signed)
Pt alert & oriented x4. Patient given discharge instructions, paperwork & prescription(s). Patient verbalized understanding. Pt left department w/ no further questions.  

## 2013-02-09 NOTE — ED Provider Notes (Signed)
CSN: [id number]     Arrival date & time 02/09/13  0142 History     None    Chief Complaint  Patient presents with  . Joint Swelling  . Leg Injury   HPI Sherri Reed is a 62 y.o. female took an Ambien this evening, lay down on the couch and fell asleep. She thought her son was going to be late for work so she jumped up twisted on her ankle and then fell over with immediate pain on the right ankle. She is unable to stand initially. She says the patient is severe, associated with swelling of the right ankle, she has not taken anything for it, she is to apply ice at home. No prior orthopedic injuries.   Past Medical History  Diagnosis Date  . Unspecified essential hypertension   . Overweight(278.02)   . Palpitations   . Nonspecific abnormal electrocardiogram (ECG) (EKG)    Past Surgical History  Procedure Laterality Date  . Cesarean section    . Tonsillectomy     Family History  Problem Relation Age of Onset  . Coronary artery disease Mother   . Emphysema Mother     was a smoker  . Breast cancer Maternal Aunt   . Brain cancer Maternal Grandmother    History  Substance Use Topics  . Smoking status: Former Smoker -- 1.50 packs/day for 30 years    Types: Cigarettes    Quit date: 07/16/2005  . Smokeless tobacco: Never Used  . Alcohol Use: No   OB History   Grav Para Term Preterm Abortions TAB SAB Ect Mult Living                 Review of Systems At least 10pt or greater review of systems completed and are negative except where specified in the HPI.  Allergies  Lisinopril  Home Medications   Current Outpatient Rx  Name  Route  Sig  Dispense  Refill  . Albuterol Sulfate (PROVENTIL HFA IN)   Inhalation   Inhale 2 puffs into the lungs every 6 (six) hours as needed.           Marland Kitchen aspirin 81 MG tablet   Oral   Take 81 mg by mouth daily.           . metoprolol-hydrochlorothiazide (LOPRESSOR HCT) 100-25 MG per tablet   Oral   Take 1 tablet by mouth daily.         Marland Kitchen tiotropium (SPIRIVA) 18 MCG inhalation capsule   Inhalation   Place 18 mcg into inhaler and inhale daily.           Marland Kitchen zolpidem (AMBIEN) 5 MG tablet   Oral   Take 5 mg by mouth at bedtime as needed for sleep.         Marland Kitchen EXPIRED: budesonide-formoterol (SYMBICORT) 160-4.5 MCG/ACT inhaler   Inhalation   Inhale 2 puffs into the lungs 2 (two) times daily.   1 Inhaler   12    BP 114/52  Pulse 74  Temp(Src) 98 F (36.7 C) (Oral)  Resp 20  Ht 5\' 4"  (1.626 m)  Wt 196 lb (88.905 kg)  BMI 33.63 kg/m2  SpO2 96% Physical Exam  Nursing notes reviewed.  Electronic medical record reviewed. VITAL SIGNS:   Filed Vitals:   02/09/13 0144 02/09/13 0229  BP: 114/52   Pulse: 74   Temp: 98 F (36.7 C)   TempSrc: Oral   Resp: 20   Height: 5\' 4"  (1.626 m)  Weight: 196 lb (88.905 kg)   SpO2: 96% 94%   CONSTITUTIONAL: Awake, oriented, appears non-toxic HENT: Atraumatic, normocephalic, oral mucosa pink and moist, airway patent. Nares patent without drainage. External ears normal. EYES: Conjunctiva clear, EOMI, PERRLA NECK: Trachea midline, non-tender, supple CARDIOVASCULAR: Normal heart rate, Normal rhythm, No murmurs, rubs, gallops PULMONARY/CHEST: Clear to auscultation, no rhonchi, wheezes, or rales. Symmetrical breath sounds. Non-tender. ABDOMINAL: Non-distended, soft, non-tender - no rebound or guarding.  BS normal. NEUROLOGIC: Non-focal, moving all four extremities, no gross sensory or motor deficits. EXTREMITIES: No clubbing, cyanosis, or edema. Swelling and mild ecchymosis at the lateral malleolus, further swelling and ecchymosis over the ATFL. No tenderness at the proximal fibula or knee. No tenderness over the head of the fifth metatarsal SKIN: Warm, Dry, No erythema, No rash  ED Course   Procedures (including critical care time)  Labs Reviewed - No data to display Dg Ankle Complete Right  02/09/2013   *RADIOLOGY REPORT*  Clinical Data: Leg swelling.  RIGHT ANKLE -  COMPLETE 3+ VIEW  Comparison: None.  Findings: Small well corticated avulsion fracture fragments are present adjacent to the medial malleolus.  Calcaneal spurs incidentally noted.  The ankle mortise is congruent.  Talar dome is intact. Anatomic alignment.  IMPRESSION: No acute abnormality.   Original Report Authenticated By: Andreas Newport, M.D.   1. Right ankle sprain, initial encounter    Medications  oxyCODONE-acetaminophen (PERCOCET/ROXICET) 5-325 MG per tablet 2 tablet (2 tablets Oral Given 02/09/13 0223)  ibuprofen (ADVIL,MOTRIN) tablet 800 mg (800 mg Oral Given 02/09/13 0223)  ipratropium (ATROVENT) nebulizer solution 0.5 mg (0.5 mg Nebulization Given 02/09/13 0229)    And  albuterol (PROVENTIL) (5 MG/ML) 0.5% nebulizer solution 5 mg (5 mg Nebulization Given 02/09/13 0229)    MDM  Concern for avulsion fracture of distal fibula, x-ray is negative, ankle mortise appears congruent. We'll put the patient in a Cam Walker boot for comfort, given her instructions for ICD therapy, pain medicine and crutches. Patient to followup with Dr. Hilda Lias in about a week for likely ankle sprain, potential occult fx of distal fib.  I explained the diagnosis and have given explicit precautions to return to the ER including numbness or tingling in the right lower extremity, coldness to the touch in the right lower extremity or any other new or worsening symptoms. The patient understands and accepts the medical plan as it's been dictated and I have answered their questions. Discharge instructions concerning home care and prescriptions have been given.  The patient is STABLE and is discharged to home in good condition.   Jones Skene, MD 02/09/13 1610

## 2013-02-09 NOTE — ED Notes (Signed)
Pt thinks she twisted ankle when she stood up, pt fell after she tried to stand.

## 2013-05-31 ENCOUNTER — Emergency Department (HOSPITAL_COMMUNITY)
Admission: EM | Admit: 2013-05-31 | Discharge: 2013-05-31 | Disposition: A | Discharge status: Home / Self Care | Payer: Medicaid Other | Attending: Emergency Medicine | Admitting: Emergency Medicine

## 2013-05-31 ENCOUNTER — Emergency Department (HOSPITAL_COMMUNITY): Payer: Medicaid Other

## 2013-05-31 ENCOUNTER — Encounter (HOSPITAL_COMMUNITY): Payer: Self-pay | Admitting: Emergency Medicine

## 2013-05-31 DIAGNOSIS — Z79899 Other long term (current) drug therapy: Secondary | ICD-10-CM | POA: Insufficient documentation

## 2013-05-31 DIAGNOSIS — M25579 Pain in unspecified ankle and joints of unspecified foot: Secondary | ICD-10-CM | POA: Insufficient documentation

## 2013-05-31 DIAGNOSIS — R002 Palpitations: Secondary | ICD-10-CM | POA: Insufficient documentation

## 2013-05-31 DIAGNOSIS — I10 Essential (primary) hypertension: Secondary | ICD-10-CM | POA: Insufficient documentation

## 2013-05-31 DIAGNOSIS — Z7982 Long term (current) use of aspirin: Secondary | ICD-10-CM | POA: Insufficient documentation

## 2013-05-31 DIAGNOSIS — Z87891 Personal history of nicotine dependence: Secondary | ICD-10-CM | POA: Insufficient documentation

## 2013-05-31 DIAGNOSIS — M25569 Pain in unspecified knee: Secondary | ICD-10-CM | POA: Insufficient documentation

## 2013-05-31 DIAGNOSIS — M79604 Pain in right leg: Secondary | ICD-10-CM

## 2013-05-31 MED ORDER — PREDNISONE 50 MG PO TABS
60.0000 mg | ORAL_TABLET | Freq: Once | ORAL | Status: AC
  Administered 2013-05-31: 60 mg via ORAL
  Filled 2013-05-31 (×2): qty 1

## 2013-05-31 MED ORDER — ONDANSETRON HCL 4 MG PO TABS
4.0000 mg | ORAL_TABLET | Freq: Once | ORAL | Status: AC
  Administered 2013-05-31: 4 mg via ORAL
  Filled 2013-05-31: qty 1

## 2013-05-31 MED ORDER — ENOXAPARIN SODIUM 100 MG/ML ~~LOC~~ SOLN
89.0000 mg | Freq: Once | SUBCUTANEOUS | Status: AC
  Administered 2013-05-31: 90 mg via SUBCUTANEOUS
  Filled 2013-05-31: qty 1

## 2013-05-31 MED ORDER — PREDNISONE 10 MG PO TABS: ORAL_TABLET | ORAL | Status: DC

## 2013-05-31 MED ORDER — HYDROCODONE-ACETAMINOPHEN 5-325 MG PO TABS: 1.0000 | ORAL_TABLET | ORAL | Status: DC | PRN

## 2013-05-31 MED ORDER — HYDROCODONE-ACETAMINOPHEN 5-325 MG PO TABS
2.0000 | ORAL_TABLET | Freq: Once | ORAL | Status: AC
  Administered 2013-05-31: 2 via ORAL
  Filled 2013-05-31: qty 2

## 2013-05-31 NOTE — ED Notes (Signed)
Patient states that she is having right leg pain. States that she that the bottom of her foot is tingling. I fell two months ago and it was swollen so bad that they could not tell if it was broken. It has gotten worse instead of better per pt. + pedal pulses. States that she went Dr. Hilda Lias for follow up and he did not x-ray it again.

## 2013-05-31 NOTE — ED Provider Notes (Signed)
CSN: [id number]     Arrival date & time 05/31/13  1934 History   First MD Initiated Contact with Patient 05/31/13 1943     Chief Complaint  Patient presents with  . Leg Pain  . Ankle Pain   (Consider location/radiation/quality/duration/timing/severity/associated sxs/prior Treatment) HPI Comments: Patient is a 62 year old female who presents to the emergency department with ankle pain, foot pain, and pain behind the right knee. Patient states that she sustained a fall approximately 2 months ago injuring the right foot and ankle. She had a lot of swelling. X-rays at that time were inconclusive. The patient was referred to Dr. Hilda Lias. Dr. Hilda Lias evaluated the patient and per the patient stated that she did need anymore x-rays. The patient states she's been having pain in her foot and ankle since that time at times some swelling. She presents at this time to requests additional x-rays.  The patient also states she has pain behind the right knee. She has pain that goes into the thigh and also into the upper portion of the calf. She states that she has not noticed the area being hot to touch. She has some pain particularly when she puts weight on it. Patient denies any previous history of blood clots.  Patient is a 62 y.o. female presenting with leg pain and ankle pain. The history is provided by the patient.  Leg Pain Associated symptoms: no back pain and no neck pain   Ankle Pain Associated symptoms: no back pain and no neck pain     Past Medical History  Diagnosis Date  . Unspecified essential hypertension   . Overweight(278.02)   . Palpitations   . Nonspecific abnormal electrocardiogram (ECG) (EKG)    Past Surgical History  Procedure Laterality Date  . Cesarean section    . Tonsillectomy     Family History  Problem Relation Age of Onset  . Coronary artery disease Mother   . Emphysema Mother     was a smoker  . Breast cancer Maternal Aunt   . Brain cancer Maternal Grandmother     History  Substance Use Topics  . Smoking status: Former Smoker -- 1.50 packs/day for 30 years    Types: Cigarettes    Quit date: 07/16/2005  . Smokeless tobacco: Never Used  . Alcohol Use: No   OB History   Grav Para Term Preterm Abortions TAB SAB Ect Mult Living                 Review of Systems  Constitutional: Negative for activity change.       All ROS Neg except as noted in HPI  HENT: Negative for nosebleeds.   Eyes: Negative for photophobia and discharge.  Respiratory: Negative for cough, shortness of breath and wheezing.   Cardiovascular: Positive for palpitations. Negative for chest pain.  Gastrointestinal: Negative for abdominal pain and blood in stool.  Genitourinary: Negative for dysuria, frequency and hematuria.  Musculoskeletal: Positive for arthralgias. Negative for back pain and neck pain.  Skin: Negative.   Neurological: Negative for dizziness, seizures and speech difficulty.  Psychiatric/Behavioral: Negative for hallucinations and confusion.    Allergies  Lisinopril  Home Medications   Current Outpatient Rx  Name  Route  Sig  Dispense  Refill  . Albuterol Sulfate (PROVENTIL HFA IN)   Inhalation   Inhale 2 puffs into the lungs every 6 (six) hours as needed.           Marland Kitchen aspirin 81 MG tablet   Oral  Take 81 mg by mouth daily.           Marland Kitchen EXPIRED: budesonide-formoterol (SYMBICORT) 160-4.5 MCG/ACT inhaler   Inhalation   Inhale 2 puffs into the lungs 2 (two) times daily.   1 Inhaler   12   . ibuprofen (ADVIL,MOTRIN) 600 MG tablet   Oral   Take 1 tablet (600 mg total) by mouth every 6 (six) hours as needed for pain.   30 tablet   0   . metoprolol-hydrochlorothiazide (LOPRESSOR HCT) 100-25 MG per tablet   Oral   Take 1 tablet by mouth daily.         Marland Kitchen oxyCODONE-acetaminophen (PERCOCET/ROXICET) 5-325 MG per tablet   Oral   Take 1-2 tablets by mouth every 6 (six) hours as needed for pain.   23 tablet   0   . tiotropium (SPIRIVA) 18  MCG inhalation capsule   Inhalation   Place 18 mcg into inhaler and inhale daily.           Marland Kitchen zolpidem (AMBIEN) 5 MG tablet   Oral   Take 5 mg by mouth at bedtime as needed for sleep.          BP 130/71  Pulse 92  Temp(Src) 98.1 F (36.7 C) (Oral)  Resp 20  Ht 5\' 5"  (1.651 m)  Wt 197 lb (89.359 kg)  BMI 32.78 kg/m2  SpO2 96% Physical Exam  Nursing note and vitals reviewed. Constitutional: She is oriented to person, place, and time. She appears well-developed and well-nourished.  Non-toxic appearance.  HENT:  Head: Normocephalic.  Right Ear: Tympanic membrane and external ear normal.  Left Ear: Tympanic membrane and external ear normal.  Eyes: EOM and lids are normal. Pupils are equal, round, and reactive to light.  Neck: Normal range of motion. Neck supple. Carotid bruit is not present.  Cardiovascular: Normal rate, regular rhythm, normal heart sounds, intact distal pulses and normal pulses.   Pulmonary/Chest: Breath sounds normal. No respiratory distress.  Abdominal: Soft. Bowel sounds are normal. There is no tenderness. There is no guarding.  Musculoskeletal: Normal range of motion.  There is pain to palpation just behind the right knee. There is no palpable mass appreciated. There is no effusion noted of the right knee.  There is pain to palpation and attempted range of motion of the right foot and ankle. There is mild swelling of the ankle. No hot areas appreciated. There no lesions between the toes. There no puncture wounds of the plantar surface.  Lymphadenopathy:       Head (right side): No submandibular adenopathy present.       Head (left side): No submandibular adenopathy present.    She has no cervical adenopathy.  Neurological: She is alert and oriented to person, place, and time. She has normal strength. No cranial nerve deficit or sensory deficit.  Skin: Skin is warm and dry.  Psychiatric: She has a normal mood and affect. Her speech is normal.    ED  Course  Procedures (including critical care time) Labs Review Labs Reviewed - No data to display Imaging Review No results found.  EKG Interpretation   None       MDM  No diagnosis found. *I have reviewed nursing notes, vital signs, and all appropriate lab and imaging results for this patient.**  The D-dimer test is slightly elevated at 0.92. The patient is treated with Lovenox. Arrangements have been made for a Doppler study of the lower extremity on the right for tomorrow  morning.  X-ray of the right foot reveals a lucency through the base of the fifth metatarsal and it was questioned as to whether or not this may represent a nondisplaced fracture.  X-ray of the right ankle is negative for fracture or dislocation. There is a large plantar calcaneal spur present. There is question of remote avulsive injuries of the medial malleolus and tip of the fibula. These were seen on previous x-rays.  Patient is to have her Doppler study done in the radiology department on tomorrow morning. Prescription for Norco and prednisone given to the patient.  Kathie Dike, PA-C 06/02/13 [phone]

## 2013-06-01 ENCOUNTER — Ambulatory Visit (HOSPITAL_COMMUNITY): Payer: Medicaid Other | Attending: Emergency Medicine

## 2013-06-02 NOTE — ED Provider Notes (Signed)
Medical screening examination/treatment/procedure(s) were conducted as a shared visit with non-physician practitioner(s) and myself.  I personally evaluated the patient during the encounter.  EKG Interpretation   None      Doppler study right lower extremity pending. Concern for fracture right 5th metatarsal base.  Neurovascular contact  Donnetta Hutching, MD 06/02/13 1054

## 2013-06-22 ENCOUNTER — Encounter: Payer: Self-pay | Admitting: Cardiology

## 2013-11-27 ENCOUNTER — Other Ambulatory Visit (HOSPITAL_COMMUNITY): Payer: Self-pay | Admitting: Internal Medicine

## 2013-11-27 DIAGNOSIS — Z1231 Encounter for screening mammogram for malignant neoplasm of breast: Secondary | ICD-10-CM

## 2013-12-01 ENCOUNTER — Ambulatory Visit (HOSPITAL_COMMUNITY): Payer: Medicaid Other

## 2013-12-08 ENCOUNTER — Ambulatory Visit (HOSPITAL_COMMUNITY)
Admission: RE | Admit: 2013-12-08 | Discharge: 2013-12-08 | Disposition: A | Discharge status: Home / Self Care | Payer: Medicaid Other | Source: Ambulatory Visit | Attending: Internal Medicine | Admitting: Internal Medicine

## 2013-12-08 DIAGNOSIS — Z1231 Encounter for screening mammogram for malignant neoplasm of breast: Secondary | ICD-10-CM

## 2013-12-08 DIAGNOSIS — R928 Other abnormal and inconclusive findings on diagnostic imaging of breast: Secondary | ICD-10-CM | POA: Insufficient documentation

## 2013-12-10 ENCOUNTER — Other Ambulatory Visit: Payer: Self-pay | Admitting: Internal Medicine

## 2013-12-10 DIAGNOSIS — R928 Other abnormal and inconclusive findings on diagnostic imaging of breast: Secondary | ICD-10-CM

## 2013-12-16 ENCOUNTER — Ambulatory Visit (HOSPITAL_COMMUNITY)
Admission: RE | Admit: 2013-12-16 | Discharge: 2013-12-16 | Disposition: A | Discharge status: Home / Self Care | Payer: Medicaid Other | Source: Ambulatory Visit | Attending: Internal Medicine | Admitting: Internal Medicine

## 2013-12-16 ENCOUNTER — Other Ambulatory Visit: Payer: Self-pay | Admitting: Internal Medicine

## 2013-12-16 DIAGNOSIS — R928 Other abnormal and inconclusive findings on diagnostic imaging of breast: Secondary | ICD-10-CM | POA: Insufficient documentation

## 2013-12-16 DIAGNOSIS — R921 Mammographic calcification found on diagnostic imaging of breast: Secondary | ICD-10-CM

## 2013-12-28 ENCOUNTER — Ambulatory Visit
Admission: RE | Admit: 2013-12-28 | Discharge: 2013-12-28 | Disposition: A | Discharge status: Home / Self Care | Payer: Medicaid Other | Source: Ambulatory Visit | Attending: Internal Medicine | Admitting: Internal Medicine

## 2013-12-28 DIAGNOSIS — R921 Mammographic calcification found on diagnostic imaging of breast: Secondary | ICD-10-CM

## 2014-01-09 ENCOUNTER — Encounter: Payer: Self-pay | Admitting: Cardiology

## 2014-01-13 ENCOUNTER — Encounter: Payer: Self-pay | Admitting: Cardiology

## 2014-01-27 ENCOUNTER — Encounter: Payer: Self-pay | Admitting: Cardiology

## 2014-01-27 LAB — PULMONARY FUNCTION TEST

## 2014-04-28 DIAGNOSIS — R072 Precordial pain: Secondary | ICD-10-CM | POA: Diagnosis not present

## 2014-04-28 DIAGNOSIS — Z1389 Encounter for screening for other disorder: Secondary | ICD-10-CM | POA: Diagnosis not present

## 2014-04-28 DIAGNOSIS — Z1212 Encounter for screening for malignant neoplasm of rectum: Secondary | ICD-10-CM | POA: Diagnosis not present

## 2014-04-28 DIAGNOSIS — Z01411 Encounter for gynecological examination (general) (routine) with abnormal findings: Secondary | ICD-10-CM | POA: Diagnosis not present

## 2014-04-28 DIAGNOSIS — Z23 Encounter for immunization: Secondary | ICD-10-CM | POA: Diagnosis not present

## 2014-04-28 DIAGNOSIS — Z0001 Encounter for general adult medical examination with abnormal findings: Secondary | ICD-10-CM | POA: Diagnosis not present

## 2014-04-29 ENCOUNTER — Encounter: Payer: Self-pay | Admitting: *Deleted

## 2014-04-29 DIAGNOSIS — Z0001 Encounter for general adult medical examination with abnormal findings: Secondary | ICD-10-CM | POA: Diagnosis not present

## 2014-04-29 DIAGNOSIS — R87615 Unsatisfactory cytologic smear of cervix: Secondary | ICD-10-CM | POA: Diagnosis not present

## 2014-04-29 DIAGNOSIS — R072 Precordial pain: Secondary | ICD-10-CM | POA: Diagnosis not present

## 2014-04-30 ENCOUNTER — Ambulatory Visit (INDEPENDENT_AMBULATORY_CARE_PROVIDER_SITE_OTHER): Payer: Medicare Other | Admitting: Cardiology

## 2014-04-30 ENCOUNTER — Encounter: Payer: Self-pay | Admitting: Cardiology

## 2014-04-30 ENCOUNTER — Encounter (HOSPITAL_COMMUNITY): Payer: Self-pay

## 2014-04-30 ENCOUNTER — Encounter: Payer: Self-pay | Admitting: *Deleted

## 2014-04-30 ENCOUNTER — Other Ambulatory Visit: Payer: Self-pay | Admitting: Cardiology

## 2014-04-30 ENCOUNTER — Telehealth: Payer: Self-pay | Admitting: Cardiology

## 2014-04-30 VITALS — BP 123/75 | HR 80 | Ht 66.0 in | Wt 193.2 lb

## 2014-04-30 DIAGNOSIS — I10 Essential (primary) hypertension: Secondary | ICD-10-CM | POA: Diagnosis not present

## 2014-04-30 DIAGNOSIS — R072 Precordial pain: Secondary | ICD-10-CM

## 2014-04-30 DIAGNOSIS — J449 Chronic obstructive pulmonary disease, unspecified: Secondary | ICD-10-CM | POA: Diagnosis not present

## 2014-04-30 DIAGNOSIS — I200 Unstable angina: Secondary | ICD-10-CM

## 2014-04-30 NOTE — Assessment & Plan Note (Signed)
Gold stage III with prior long-term history of tobacco abuse.

## 2014-04-30 NOTE — Progress Notes (Signed)
Clinical Summary Ms. Summerhill is a 63 y.o.female referred for cardiology consultation by Ms. Georgianne Fick. at [facility]. Patient was just seen here to establish care. She describes a progressive history of exertional shortness of breath and chest "heaviness" that has been present over the last several months. She has used inhalers to manage this, symptoms of COPD has been felt to be most likely, although she reports that her symptoms are worsening despite treatment in terms of frequency and intensity. She was recently given nitroglycerin, and has tried this with improvement in her symptoms.  ECG today in the office shows normal sinus rhythm with nonspecific T-wave changes. Recent tracing from [facility] noted, showing leftward axis with more prominent anterolateral T wave inversions and decreased R wave progression.  She was admitted to [facility] back in July with reported COPD exacerbation, managed by Dr. Sherrie Sport. PFTs at that time were consistent with COPD, Gold stage III. Cardiac markers were normal. Chest x-ray reported no infiltrates with mild interstitial changes. Lab work from July showed BUN 16, creatinine 0.7, potassium 3.6, hemoglobin 12.9, platelets 351. ECG from June showed sinus rhythm with nonspecific ST-T changes.  She did undergo a Lexiscan Cardiolite in July 2014 at [place] reporting no diagnostic ST segment changes, moderate sized, fixed anteroapical, apical and inferoapical defect that was consistent with soft tissue attenuation, LVEF 66%.  Record review finds evaluation by Dr. Verl Blalock back in 2010 secondary to palpitations. Echocardiogram at that time revealed upper normal LV wall thickness and chamber size with LVEF 50-55%, mild left atrial enlargement, trivial mitral regurgitation and tricuspid regurgitation.   Allergies  Allergen Reactions  . Lisinopril Swelling    Current Outpatient Prescriptions  Medication Sig Dispense Refill  . acetaminophen (TYLENOL) 500 MG tablet Take  500-1,000 mg by mouth as needed for mild pain or moderate pain.       Marland Kitchen albuterol (PROAIR HFA) 108 (90 BASE) MCG/ACT inhaler Inhale 1 puff into the lungs every 6 (six) hours as needed for wheezing or shortness of breath.      Marland Kitchen aspirin 81 MG tablet Take 81 mg by mouth daily.        . Fluticasone-Salmeterol (ADVAIR) 500-50 MCG/DOSE AEPB Inhale 1 puff into the lungs 2 (two) times daily.      Marland Kitchen ibuprofen (ADVIL,MOTRIN) 600 MG tablet Take 600 mg by mouth as needed.      . nitroGLYCERIN (NITROSTAT) 0.4 MG SL tablet Place 0.4 mg under the tongue every 5 (five) minutes as needed for chest pain.      Marland Kitchen tiotropium (SPIRIVA) 18 MCG inhalation capsule Place 18 mcg into inhaler and inhale daily.      Marland Kitchen zolpidem (AMBIEN) 5 MG tablet Take 5 mg by mouth at bedtime as needed for sleep.       No current facility-administered medications for this visit.    Past Medical History  Diagnosis Date  . Essential hypertension   . Palpitations   . COPD (chronic obstructive pulmonary disease)     Past Surgical History  Procedure Laterality Date  . Cesarean section    . Tonsillectomy    . Breast biopsy      Family History  Problem Relation Age of Onset  . Coronary artery disease Mother   . Emphysema Mother   . Breast cancer Maternal Aunt   . Brain cancer Maternal Grandmother     Social History Ms. Crayton reports that she quit smoking about 8 years ago. Her smoking use included Cigarettes. She has a  45 pack-year smoking history. She has never used smokeless tobacco. Ms. Cantu reports that she does not drink alcohol.  Review of Systems No palpitations or syncope. She reports no bleeding diathesis. NYHA class 2-3 dyspnea. No orthopnea or PND. No claudication. Other systems reviewed and negative except as outlined.  Physical Examination Filed Vitals:   04/30/14 0838  BP: 123/75  Pulse: 80   Filed Weights   04/30/14 0838  Weight: 193 lb 4 oz (87.658 kg)   Overweight woman, appears nervous but in  no distress. HEENT: Conjunctiva and lids normal, oropharynx clear. Neck: Supple, no elevated JVP or carotid bruits, no thyromegaly. Lungs: Decreased breath sounds with scattered rhonchi, no wheezing, nonlabored breathing at rest. Cardiac: Regular rate and rhythm, no S3 or significant systolic murmur, no pericardial rub. Abdomen: Soft, nontender, bowel sounds present, no guarding or rebound. Extremities: No pitting edema, distal pulses 2+. Skin: Warm and dry. Musculoskeletal: No kyphosis. Neuropsychiatric: Alert and oriented x3, affect grossly appropriate.   Problem List and Plan   Accelerating angina Patient reporting exertional shortness of breath and chest heaviness concerning for angina, although in the setting of known COPD as well. She had a reassuring noninvasive workup in 2014, although reports progressive symptoms in the last several months. Recent tracings reviewed with overall nonspecific ST-T wave abnormalities. Cardiac risk factor profile includes hypertension, prior long-term history of tobacco abuse, also CAD in her mother. We discussed options for further evaluation, and plan is to proceed with a diagnostic heart catheterization to most clearly evaluate her coronary anatomy. We discussed the risks and benefits, and she is in agreement to proceed.  Essential hypertension Blood pressure control today.  COPD (chronic obstructive pulmonary disease) Gold stage III with prior long-term history of tobacco abuse.    Satira Sark, M.D., F.A.C.C.

## 2014-04-30 NOTE — Assessment & Plan Note (Signed)
Blood pressure control today

## 2014-04-30 NOTE — Telephone Encounter (Signed)
Left heart cath - Friday, 10/23 - 7:30  [place]

## 2014-04-30 NOTE — Telephone Encounter (Signed)
Patient has Medicare & Medicaid.  No prior authorization is required for left heart cath.

## 2014-04-30 NOTE — Assessment & Plan Note (Signed)
Patient reporting exertional shortness of breath and chest heaviness concerning for angina, although in the setting of known COPD as well. She had a reassuring noninvasive workup in 2014, although reports progressive symptoms in the last several months. Recent tracings reviewed with overall nonspecific ST-T wave abnormalities. Cardiac risk factor profile includes hypertension, prior long-term history of tobacco abuse, also CAD in her mother. We discussed options for further evaluation, and plan is to proceed with a diagnostic heart catheterization to most clearly evaluate her coronary anatomy. We discussed the risks and benefits, and she is in agreement to proceed.

## 2014-04-30 NOTE — Patient Instructions (Signed)
Your physician has requested that you have a cardiac catheterization. Cardiac catheterization is used to diagnose and/or treat various heart conditions. Doctors may recommend this procedure for a number of different reasons. The most common reason is to evaluate chest pain. Chest pain can be a symptom of coronary artery disease (CAD), and cardiac catheterization can show whether plaque is narrowing or blocking your heart's arteries. This procedure is also used to evaluate the valves, as well as measure the blood flow and oxygen levels in different parts of your heart. For further information please visit [email or web address]. Please follow instruction sheet, as given. Continue all current medications. Follow up will be given at time of discharge.   

## 2014-05-07 ENCOUNTER — Ambulatory Visit (HOSPITAL_COMMUNITY)
Admission: RE | Admit: 2014-05-07 | Discharge: 2014-05-07 | Disposition: A | Discharge status: Home / Self Care | Payer: Medicare Other | Source: Ambulatory Visit | Attending: Cardiovascular Disease | Admitting: Cardiovascular Disease

## 2014-05-07 ENCOUNTER — Encounter (HOSPITAL_COMMUNITY)
Admission: RE | Disposition: A | Discharge status: Home / Self Care | Payer: Self-pay | Source: Ambulatory Visit | Attending: Cardiovascular Disease

## 2014-05-07 DIAGNOSIS — R0789 Other chest pain: Secondary | ICD-10-CM | POA: Insufficient documentation

## 2014-05-07 DIAGNOSIS — I10 Essential (primary) hypertension: Secondary | ICD-10-CM | POA: Diagnosis not present

## 2014-05-07 DIAGNOSIS — Z8249 Family history of ischemic heart disease and other diseases of the circulatory system: Secondary | ICD-10-CM | POA: Diagnosis not present

## 2014-05-07 DIAGNOSIS — Z7982 Long term (current) use of aspirin: Secondary | ICD-10-CM | POA: Insufficient documentation

## 2014-05-07 DIAGNOSIS — J449 Chronic obstructive pulmonary disease, unspecified: Secondary | ICD-10-CM | POA: Insufficient documentation

## 2014-05-07 DIAGNOSIS — Z87891 Personal history of nicotine dependence: Secondary | ICD-10-CM | POA: Insufficient documentation

## 2014-05-07 DIAGNOSIS — I200 Unstable angina: Secondary | ICD-10-CM

## 2014-05-07 HISTORY — PX: LEFT HEART CATHETERIZATION WITH CORONARY ANGIOGRAM: SHX5451

## 2014-05-07 LAB — BASIC METABOLIC PANEL
ANION GAP: 12 (ref 5–15)
BUN: 12 mg/dL (ref 6–23)
CHLORIDE: 102 meq/L (ref 96–112)
CO2: 27 mEq/L (ref 19–32)
Calcium: 9.9 mg/dL (ref 8.4–10.5)
Creatinine, Ser: 0.81 mg/dL (ref 0.50–1.10)
GFR calc non Af Amer: 77 mL/min — ABNORMAL LOW (ref >90)
GFR, EST AFRICAN AMERICAN: 89 mL/min — AB (ref >90)
Glucose, Bld: 114 mg/dL — ABNORMAL HIGH (ref 70–99)
Potassium: 4.1 mEq/L (ref 3.7–5.3)
SODIUM: 141 meq/L (ref 137–147)

## 2014-05-07 LAB — CBC
HCT: 34.7 % — ABNORMAL LOW (ref 36.0–46.0)
HEMOGLOBIN: 11.2 g/dL — AB (ref 12.0–15.0)
MCH: 27.1 pg (ref 26.0–34.0)
MCHC: 32.3 g/dL (ref 30.0–36.0)
MCV: 84 fL (ref 78.0–100.0)
Platelets: 294 10*3/uL (ref 150–400)
RBC: 4.13 MIL/uL (ref 3.87–5.11)
RDW: 12.8 % (ref 11.5–15.5)
WBC: 6.1 10*3/uL (ref 4.0–10.5)

## 2014-05-07 LAB — PROTIME-INR
INR: 0.99 (ref 0.00–1.49)
PROTHROMBIN TIME: 13.2 s (ref 11.6–15.2)

## 2014-05-07 SURGERY — LEFT HEART CATHETERIZATION WITH CORONARY ANGIOGRAM
Anesthesia: LOCAL

## 2014-05-07 MED ORDER — SODIUM CHLORIDE 0.9 % IV SOLN: INTRAVENOUS | Status: AC

## 2014-05-07 MED ORDER — SODIUM CHLORIDE 0.9 % IV SOLN: 250.0000 mL | INTRAVENOUS | Status: DC | PRN

## 2014-05-07 MED ORDER — VERAPAMIL HCL 2.5 MG/ML IV SOLN
INTRAVENOUS | Status: AC
  Filled 2014-05-07: qty 2

## 2014-05-07 MED ORDER — MIDAZOLAM HCL 2 MG/2ML IJ SOLN
INTRAMUSCULAR | Status: AC
  Filled 2014-05-07: qty 2

## 2014-05-07 MED ORDER — LIDOCAINE HCL (PF) 1 % IJ SOLN
INTRAMUSCULAR | Status: AC
  Filled 2014-05-07: qty 30

## 2014-05-07 MED ORDER — SODIUM CHLORIDE 0.9 % IV SOLN
INTRAVENOUS | Status: DC
  Administered 2014-05-07: 06:00:00 via INTRAVENOUS

## 2014-05-07 MED ORDER — HEPARIN SODIUM (PORCINE) 1000 UNIT/ML IJ SOLN
INTRAMUSCULAR | Status: AC
  Filled 2014-05-07: qty 1

## 2014-05-07 MED ORDER — HEPARIN (PORCINE) IN NACL 2-0.9 UNIT/ML-% IJ SOLN
INTRAMUSCULAR | Status: AC
  Filled 2014-05-07: qty 1500

## 2014-05-07 MED ORDER — SODIUM CHLORIDE 0.9 % IJ SOLN: 3.0000 mL | INTRAMUSCULAR | Status: DC | PRN

## 2014-05-07 MED ORDER — FENTANYL CITRATE 0.05 MG/ML IJ SOLN
INTRAMUSCULAR | Status: AC
  Filled 2014-05-07: qty 2

## 2014-05-07 MED ORDER — SODIUM CHLORIDE 0.9 % IJ SOLN
3.0000 mL | Freq: Two times a day (BID) | INTRAMUSCULAR | Status: DC
  Administered 2014-05-07: 3 mL via INTRAVENOUS

## 2014-05-07 MED ORDER — ASPIRIN 81 MG PO CHEW: 81.0000 mg | CHEWABLE_TABLET | ORAL | Status: DC

## 2014-05-07 NOTE — CV Procedure (Signed)
      Cardiac Catheterization Operative Report  Sherri Reed [id number] 10/23/20158:02 AM [place]  Procedure Performed:  1. Left Heart Catheterization 2. Selective Coronary Angiography 3. Left ventricular angiogram  Operator: Lauree Chandler, MD  Arterial access site:  Right radial artery.   Indication: 63 yo female with history of COPD, HTN with recent chest pressure concerning for angina.                                      Procedure Details: The risks, benefits, complications, treatment options, and expected outcomes were discussed with the patient. The patient and/or family concurred with the proposed plan, giving informed consent. The patient was brought to the cath lab after IV hydration was begun and oral premedication was given. The patient was further sedated with Versed and Fentanyl. The right wrist was assessed with a modified Allens test which was positive. The right wrist was prepped and draped in a sterile fashion. 1% lidocaine was used for local anesthesia. Using the modified Seldinger access technique, a 5 French sheath was placed in the right radial artery. 3 mg Verapamil was given through the sheath. 4500 units IV heparin was given. Standard diagnostic catheters were used to perform selective coronary angiography. A pigtail catheter was used to perform a left ventricular angiogram. The sheath was removed from the right radial artery and a Terumo hemostasis band was applied at the arteriotomy site on the right wrist.    There were no immediate complications. The patient was taken to the recovery area in stable condition.   Hemodynamic Findings: Central aortic pressure: 123/66 Left ventricular pressure: 113/9/13  Angiographic Findings:  Left main: No obstructive disease.   Left Anterior Descending Artery: Large caliber vessel that courses to the apex. Moderate caliber diagonal branch. No obstructive disease.   Circumflex Artery: Moderate  caliber vessel with small caliber bifurcating obtuse marginal branch. No obstructive disease.   Right Coronary Artery: Large dominant vessel with no obstructive disease.   Left Ventricular Angiogram: LVEF=55%.   Impression: 1. No angiographic evidence of CAD 2. Normal LV function 3. Non-cardiac chest pain  Recommendations: No further ischemic evaluation.        Complications:  None. The patient tolerated the procedure well.

## 2014-05-07 NOTE — Interval H&P Note (Signed)
History and Physical Interval Note:  05/07/2014 7:29 AM  Sherri Reed  has presented today for cardiac cath with the diagnosis of Chest Pain.  The various methods of treatment have been discussed with the patient and family. After consideration of risks (including bleeding, infection, contrast nephropathy, MI, CVA, death), benefits and other options for treatment, the patient has consented to  Procedure(s): LEFT HEART CATHETERIZATION WITH CORONARY ANGIOGRAM (N/A) as a surgical intervention .  The patient's history has been reviewed, patient examined, no change in status, stable for surgery.  I have reviewed the patient's chart and labs.  Questions were answered to the patient's satisfaction.    Cath Lab Visit (complete for each Cath Lab visit)  Clinical Evaluation Leading to the Procedure:   ACS: No.  Non-ACS:    Anginal Classification: CCS III  Anti-ischemic medical therapy: No Therapy  Non-Invasive Test Results: No non-invasive testing performed  Prior CABG: No previous CABG         MCALHANY,CHRISTOPHER

## 2014-05-07 NOTE — Discharge Instructions (Signed)
Radial Site Care °Refer to this sheet in the next few weeks. These instructions provide you with information on caring for yourself after your procedure. Your caregiver may also give you more specific instructions. Your treatment has been planned according to current medical practices, but problems sometimes occur. Call your caregiver if you have any problems or questions after your procedure. °HOME CARE INSTRUCTIONS °· You may shower the day after the procedure. Remove the bandage (dressing) and gently wash the site with plain soap and water. Gently pat the site dry. °· Do not apply powder or lotion to the site. °· Do not submerge the affected site in water for 3 to 5 days. °· Inspect the site at least twice daily. °· Do not flex or bend the affected arm for 24 hours. °· No lifting over 5 pounds (2.3 kg) for 5 days after your procedure. °· Do not drive home if you are discharged the same day of the procedure. Have someone else drive you. °· You may drive 24 hours after the procedure unless otherwise instructed by your caregiver. °· Do not operate machinery or power tools for 24 hours. °· A responsible adult should be with you for the first 24 hours after you arrive home. °What to expect: °· Any bruising will usually fade within 1 to 2 weeks. °· Blood that collects in the tissue (hematoma) may be painful to the touch. It should usually decrease in size and tenderness within 1 to 2 weeks. °SEEK IMMEDIATE MEDICAL CARE IF: °· You have unusual pain at the radial site. °· You have redness, warmth, swelling, or pain at the radial site. °· You have drainage (other than a small amount of blood on the dressing). °· You have chills. °· You have a fever or persistent symptoms for more than 72 hours. °· You have a fever and your symptoms suddenly get worse. °· Your arm becomes pale, cool, tingly, or numb. °· You have heavy bleeding from the site. Hold pressure on the site and call 911. °Document Released: 08/04/2010 Document  Revised: 09/24/2011 Document Reviewed: 08/04/2010 °ExitCare® Patient Information ©2015 [employer]. This information is not intended to replace advice given to you by your health care provider. Make sure you discuss any questions you have with your health care provider. ° °

## 2014-05-07 NOTE — H&P (View-Only) (Signed)
Clinical Summary Sherri Reed is a 63 y.o.female referred for cardiology consultation by Sherri Reed. at [facility]. Patient was just seen here to establish care. She describes a progressive history of exertional shortness of breath and chest "heaviness" that has been present over the last several months. She has used inhalers to manage this, symptoms of COPD has been felt to be most likely, although she reports that her symptoms are worsening despite treatment in terms of frequency and intensity. She was recently given nitroglycerin, and has tried this with improvement in her symptoms.  ECG today in the office shows normal sinus rhythm with nonspecific T-wave changes. Recent tracing from [facility] noted, showing leftward axis with more prominent anterolateral T wave inversions and decreased R wave progression.  She was admitted to [facility] back in July with reported COPD exacerbation, managed by Dr. Sherrie Sport. PFTs at that time were consistent with COPD, Gold stage III. Cardiac markers were normal. Chest x-ray reported no infiltrates with mild interstitial changes. Lab work from July showed BUN 16, creatinine 0.7, potassium 3.6, hemoglobin 12.9, platelets 351. ECG from June showed sinus rhythm with nonspecific ST-T changes.  She did undergo a Lexiscan Cardiolite in July 2014 at [facility] reporting no diagnostic ST segment changes, moderate sized, fixed anteroapical, apical and inferoapical defect that was consistent with soft tissue attenuation, LVEF 66%.  Record review finds evaluation by Dr. Verl Blalock back in 2010 secondary to palpitations. Echocardiogram at that time revealed upper normal LV wall thickness and chamber size with LVEF 50-55%, mild left atrial enlargement, trivial mitral regurgitation and tricuspid regurgitation.   Allergies  Allergen Reactions  . Lisinopril Swelling    Current Outpatient Prescriptions  Medication Sig Dispense Refill  . acetaminophen (TYLENOL) 500 MG tablet Take  500-1,000 mg by mouth as needed for mild pain or moderate pain.       Marland Kitchen albuterol (PROAIR HFA) 108 (90 BASE) MCG/ACT inhaler Inhale 1 puff into the lungs every 6 (six) hours as needed for wheezing or shortness of breath.      Marland Kitchen aspirin 81 MG tablet Take 81 mg by mouth daily.        . Fluticasone-Salmeterol (ADVAIR) 500-50 MCG/DOSE AEPB Inhale 1 puff into the lungs 2 (two) times daily.      Marland Kitchen ibuprofen (ADVIL,MOTRIN) 600 MG tablet Take 600 mg by mouth as needed.      . nitroGLYCERIN (NITROSTAT) 0.4 MG SL tablet Place 0.4 mg under the tongue every 5 (five) minutes as needed for chest pain.      Marland Kitchen tiotropium (SPIRIVA) 18 MCG inhalation capsule Place 18 mcg into inhaler and inhale daily.      Marland Kitchen zolpidem (AMBIEN) 5 MG tablet Take 5 mg by mouth at bedtime as needed for sleep.       No current facility-administered medications for this visit.    Past Medical History  Diagnosis Date  . Essential hypertension   . Palpitations   . COPD (chronic obstructive pulmonary disease)     Past Surgical History  Procedure Laterality Date  . Cesarean section    . Tonsillectomy    . Breast biopsy      Family History  Problem Relation Age of Onset  . Coronary artery disease Mother   . Emphysema Mother   . Breast cancer Maternal Aunt   . Brain cancer Maternal Grandmother     Social History Sherri Reed reports that she quit smoking about 8 years ago. Her smoking use included Cigarettes. She has a  45 pack-year smoking history. She has never used smokeless tobacco. Sherri Reed reports that she does not drink alcohol.  Review of Systems No palpitations or syncope. She reports no bleeding diathesis. NYHA class 2-3 dyspnea. No orthopnea or PND. No claudication. Other systems reviewed and negative except as outlined.  Physical Examination Filed Vitals:   04/30/14 0838  BP: 123/75  Pulse: 80   Filed Weights   04/30/14 0838  Weight: 193 lb 4 oz (87.658 kg)   Overweight woman, appears nervous but in  no distress. HEENT: Conjunctiva and lids normal, oropharynx clear. Neck: Supple, no elevated JVP or carotid bruits, no thyromegaly. Lungs: Decreased breath sounds with scattered rhonchi, no wheezing, nonlabored breathing at rest. Cardiac: Regular rate and rhythm, no S3 or significant systolic murmur, no pericardial rub. Abdomen: Soft, nontender, bowel sounds present, no guarding or rebound. Extremities: No pitting edema, distal pulses 2+. Skin: Warm and dry. Musculoskeletal: No kyphosis. Neuropsychiatric: Alert and oriented x3, affect grossly appropriate.   Problem List and Plan   Accelerating angina Patient reporting exertional shortness of breath and chest heaviness concerning for angina, although in the setting of known COPD as well. She had a reassuring noninvasive workup in 2014, although reports progressive symptoms in the last several months. Recent tracings reviewed with overall nonspecific ST-T wave abnormalities. Cardiac risk factor profile includes hypertension, prior long-term history of tobacco abuse, also CAD in her mother. We discussed options for further evaluation, and plan is to proceed with a diagnostic heart catheterization to most clearly evaluate her coronary anatomy. We discussed the risks and benefits, and she is in agreement to proceed.  Essential hypertension Blood pressure control today.  COPD (chronic obstructive pulmonary disease) Gold stage III with prior long-term history of tobacco abuse.    Satira Sark, M.D., F.A.C.C.

## 2014-06-02 DIAGNOSIS — E780 Pure hypercholesterolemia: Secondary | ICD-10-CM | POA: Diagnosis not present

## 2014-06-02 DIAGNOSIS — R5383 Other fatigue: Secondary | ICD-10-CM | POA: Diagnosis not present

## 2014-06-02 DIAGNOSIS — Z23 Encounter for immunization: Secondary | ICD-10-CM | POA: Diagnosis not present

## 2014-06-02 DIAGNOSIS — F339 Major depressive disorder, recurrent, unspecified: Secondary | ICD-10-CM | POA: Diagnosis not present

## 2014-06-02 DIAGNOSIS — J441 Chronic obstructive pulmonary disease with (acute) exacerbation: Secondary | ICD-10-CM | POA: Diagnosis not present

## 2014-06-21 DIAGNOSIS — Z1389 Encounter for screening for other disorder: Secondary | ICD-10-CM | POA: Diagnosis not present

## 2014-06-21 DIAGNOSIS — Z23 Encounter for immunization: Secondary | ICD-10-CM | POA: Diagnosis not present

## 2014-06-21 DIAGNOSIS — F330 Major depressive disorder, recurrent, mild: Secondary | ICD-10-CM | POA: Diagnosis not present

## 2014-06-24 ENCOUNTER — Encounter (HOSPITAL_COMMUNITY): Payer: Self-pay | Admitting: Cardiovascular Disease

## 2014-07-19 DIAGNOSIS — J441 Chronic obstructive pulmonary disease with (acute) exacerbation: Secondary | ICD-10-CM | POA: Diagnosis not present

## 2014-07-19 DIAGNOSIS — J069 Acute upper respiratory infection, unspecified: Secondary | ICD-10-CM | POA: Diagnosis not present

## 2014-09-17 DIAGNOSIS — H9313 Tinnitus, bilateral: Secondary | ICD-10-CM | POA: Diagnosis not present

## 2014-09-17 DIAGNOSIS — H918X2 Other specified hearing loss, left ear: Secondary | ICD-10-CM | POA: Diagnosis not present

## 2014-10-04 DIAGNOSIS — H9313 Tinnitus, bilateral: Secondary | ICD-10-CM | POA: Diagnosis not present

## 2014-10-04 DIAGNOSIS — H9193 Unspecified hearing loss, bilateral: Secondary | ICD-10-CM | POA: Diagnosis not present

## 2014-10-21 DIAGNOSIS — H2513 Age-related nuclear cataract, bilateral: Secondary | ICD-10-CM | POA: Diagnosis not present

## 2014-11-18 ENCOUNTER — Other Ambulatory Visit (HOSPITAL_COMMUNITY): Payer: Self-pay | Admitting: Physician Assistant

## 2014-11-18 DIAGNOSIS — Z1231 Encounter for screening mammogram for malignant neoplasm of breast: Secondary | ICD-10-CM

## 2014-12-09 DIAGNOSIS — J069 Acute upper respiratory infection, unspecified: Secondary | ICD-10-CM | POA: Diagnosis not present

## 2014-12-10 ENCOUNTER — Ambulatory Visit (HOSPITAL_COMMUNITY)
Admission: RE | Admit: 2014-12-10 | Discharge: 2014-12-10 | Disposition: A | Discharge status: Home / Self Care | Payer: Medicare Other | Source: Ambulatory Visit | Attending: Physician Assistant | Admitting: Physician Assistant

## 2014-12-10 DIAGNOSIS — Z1231 Encounter for screening mammogram for malignant neoplasm of breast: Secondary | ICD-10-CM | POA: Insufficient documentation

## 2014-12-22 DIAGNOSIS — R0600 Dyspnea, unspecified: Secondary | ICD-10-CM | POA: Diagnosis not present

## 2014-12-22 DIAGNOSIS — K529 Noninfective gastroenteritis and colitis, unspecified: Secondary | ICD-10-CM | POA: Diagnosis not present

## 2014-12-22 DIAGNOSIS — Z7951 Long term (current) use of inhaled steroids: Secondary | ICD-10-CM | POA: Diagnosis not present

## 2014-12-22 DIAGNOSIS — Z792 Long term (current) use of antibiotics: Secondary | ICD-10-CM | POA: Diagnosis not present

## 2014-12-22 DIAGNOSIS — R918 Other nonspecific abnormal finding of lung field: Secondary | ICD-10-CM | POA: Diagnosis not present

## 2014-12-22 DIAGNOSIS — R791 Abnormal coagulation profile: Secondary | ICD-10-CM | POA: Diagnosis not present

## 2014-12-22 DIAGNOSIS — J432 Centrilobular emphysema: Secondary | ICD-10-CM | POA: Diagnosis not present

## 2014-12-22 DIAGNOSIS — Z9109 Other allergy status, other than to drugs and biological substances: Secondary | ICD-10-CM | POA: Diagnosis not present

## 2014-12-22 DIAGNOSIS — J449 Chronic obstructive pulmonary disease, unspecified: Secondary | ICD-10-CM | POA: Diagnosis not present

## 2014-12-22 DIAGNOSIS — M47897 Other spondylosis, lumbosacral region: Secondary | ICD-10-CM | POA: Diagnosis not present

## 2014-12-22 DIAGNOSIS — I959 Hypotension, unspecified: Secondary | ICD-10-CM | POA: Diagnosis not present

## 2014-12-22 DIAGNOSIS — E860 Dehydration: Secondary | ICD-10-CM | POA: Diagnosis not present

## 2014-12-22 DIAGNOSIS — J9811 Atelectasis: Secondary | ICD-10-CM | POA: Diagnosis not present

## 2014-12-22 DIAGNOSIS — A084 Viral intestinal infection, unspecified: Secondary | ICD-10-CM | POA: Diagnosis not present

## 2014-12-22 DIAGNOSIS — G8929 Other chronic pain: Secondary | ICD-10-CM | POA: Diagnosis not present

## 2014-12-22 DIAGNOSIS — R748 Abnormal levels of other serum enzymes: Secondary | ICD-10-CM | POA: Diagnosis not present

## 2014-12-23 DIAGNOSIS — I959 Hypotension, unspecified: Secondary | ICD-10-CM | POA: Diagnosis not present

## 2014-12-27 DIAGNOSIS — R05 Cough: Secondary | ICD-10-CM | POA: Diagnosis not present

## 2014-12-27 DIAGNOSIS — J441 Chronic obstructive pulmonary disease with (acute) exacerbation: Secondary | ICD-10-CM | POA: Diagnosis not present

## 2015-02-20 DIAGNOSIS — F4320 Adjustment disorder, unspecified: Secondary | ICD-10-CM | POA: Diagnosis not present

## 2015-02-20 DIAGNOSIS — Z7982 Long term (current) use of aspirin: Secondary | ICD-10-CM | POA: Diagnosis not present

## 2015-02-20 DIAGNOSIS — F17200 Nicotine dependence, unspecified, uncomplicated: Secondary | ICD-10-CM | POA: Diagnosis not present

## 2015-02-20 DIAGNOSIS — J441 Chronic obstructive pulmonary disease with (acute) exacerbation: Secondary | ICD-10-CM | POA: Diagnosis not present

## 2015-02-20 DIAGNOSIS — Z7951 Long term (current) use of inhaled steroids: Secondary | ICD-10-CM | POA: Diagnosis not present

## 2015-02-28 DIAGNOSIS — Z7982 Long term (current) use of aspirin: Secondary | ICD-10-CM | POA: Diagnosis not present

## 2015-02-28 DIAGNOSIS — Z7951 Long term (current) use of inhaled steroids: Secondary | ICD-10-CM | POA: Diagnosis not present

## 2015-02-28 DIAGNOSIS — F4320 Adjustment disorder, unspecified: Secondary | ICD-10-CM | POA: Diagnosis not present

## 2015-02-28 DIAGNOSIS — J449 Chronic obstructive pulmonary disease, unspecified: Secondary | ICD-10-CM | POA: Diagnosis not present

## 2015-02-28 DIAGNOSIS — Z7952 Long term (current) use of systemic steroids: Secondary | ICD-10-CM | POA: Diagnosis not present

## 2015-02-28 DIAGNOSIS — R0789 Other chest pain: Secondary | ICD-10-CM | POA: Diagnosis not present

## 2015-02-28 DIAGNOSIS — R0602 Shortness of breath: Secondary | ICD-10-CM | POA: Diagnosis not present

## 2015-02-28 DIAGNOSIS — R079 Chest pain, unspecified: Secondary | ICD-10-CM | POA: Diagnosis not present

## 2015-02-28 DIAGNOSIS — Z79899 Other long term (current) drug therapy: Secondary | ICD-10-CM | POA: Diagnosis not present

## 2015-03-30 DIAGNOSIS — R350 Frequency of micturition: Secondary | ICD-10-CM | POA: Diagnosis not present

## 2015-05-04 DIAGNOSIS — Z0000 Encounter for general adult medical examination without abnormal findings: Secondary | ICD-10-CM | POA: Diagnosis not present

## 2015-05-04 DIAGNOSIS — E7800 Pure hypercholesterolemia, unspecified: Secondary | ICD-10-CM | POA: Diagnosis not present

## 2015-05-04 DIAGNOSIS — Z01419 Encounter for gynecological examination (general) (routine) without abnormal findings: Secondary | ICD-10-CM | POA: Diagnosis not present

## 2015-05-04 DIAGNOSIS — R5383 Other fatigue: Secondary | ICD-10-CM | POA: Diagnosis not present

## 2015-05-04 DIAGNOSIS — J449 Chronic obstructive pulmonary disease, unspecified: Secondary | ICD-10-CM | POA: Diagnosis not present

## 2015-05-04 DIAGNOSIS — R1030 Lower abdominal pain, unspecified: Secondary | ICD-10-CM | POA: Diagnosis not present

## 2015-05-04 DIAGNOSIS — H9313 Tinnitus, bilateral: Secondary | ICD-10-CM | POA: Diagnosis not present

## 2015-05-04 DIAGNOSIS — Z23 Encounter for immunization: Secondary | ICD-10-CM | POA: Diagnosis not present

## 2015-07-18 ENCOUNTER — Emergency Department (HOSPITAL_COMMUNITY): Payer: Medicare Other

## 2015-07-18 ENCOUNTER — Encounter (HOSPITAL_COMMUNITY): Payer: Self-pay | Admitting: *Deleted

## 2015-07-18 ENCOUNTER — Emergency Department (HOSPITAL_COMMUNITY)
Admission: EM | Admit: 2015-07-18 | Discharge: 2015-07-18 | Disposition: A | Discharge status: Home / Self Care | Payer: Medicare Other | Attending: Emergency Medicine | Admitting: Emergency Medicine

## 2015-07-18 DIAGNOSIS — Z7951 Long term (current) use of inhaled steroids: Secondary | ICD-10-CM | POA: Diagnosis not present

## 2015-07-18 DIAGNOSIS — M5416 Radiculopathy, lumbar region: Secondary | ICD-10-CM | POA: Insufficient documentation

## 2015-07-18 DIAGNOSIS — G8929 Other chronic pain: Secondary | ICD-10-CM | POA: Diagnosis not present

## 2015-07-18 DIAGNOSIS — Z79899 Other long term (current) drug therapy: Secondary | ICD-10-CM | POA: Insufficient documentation

## 2015-07-18 DIAGNOSIS — I10 Essential (primary) hypertension: Secondary | ICD-10-CM | POA: Insufficient documentation

## 2015-07-18 DIAGNOSIS — J449 Chronic obstructive pulmonary disease, unspecified: Secondary | ICD-10-CM | POA: Diagnosis not present

## 2015-07-18 DIAGNOSIS — M545 Low back pain, unspecified: Secondary | ICD-10-CM

## 2015-07-18 DIAGNOSIS — Z9889 Other specified postprocedural states: Secondary | ICD-10-CM | POA: Insufficient documentation

## 2015-07-18 DIAGNOSIS — Z7982 Long term (current) use of aspirin: Secondary | ICD-10-CM | POA: Diagnosis not present

## 2015-07-18 DIAGNOSIS — Z87891 Personal history of nicotine dependence: Secondary | ICD-10-CM | POA: Insufficient documentation

## 2015-07-18 DIAGNOSIS — M549 Dorsalgia, unspecified: Secondary | ICD-10-CM | POA: Diagnosis not present

## 2015-07-18 LAB — URINALYSIS, ROUTINE W REFLEX MICROSCOPIC
Bilirubin Urine: NEGATIVE
Glucose, UA: NEGATIVE mg/dL
Hgb urine dipstick: NEGATIVE
Ketones, ur: NEGATIVE mg/dL
NITRITE: NEGATIVE
PH: 6 (ref 5.0–8.0)
Protein, ur: NEGATIVE mg/dL
SPECIFIC GRAVITY, URINE: 1.01 (ref 1.005–1.030)

## 2015-07-18 LAB — URINE MICROSCOPIC-ADD ON

## 2015-07-18 MED ORDER — ACYCLOVIR 800 MG PO TABS: 800.0000 mg | ORAL_TABLET | Freq: Every day | ORAL | Status: DC

## 2015-07-18 MED ORDER — OXYCODONE-ACETAMINOPHEN 5-325 MG PO TABS
1.0000 | ORAL_TABLET | Freq: Once | ORAL | Status: AC
  Administered 2015-07-18: 1 via ORAL
  Filled 2015-07-18: qty 1

## 2015-07-18 MED ORDER — ONDANSETRON 8 MG PO TBDP
8.0000 mg | ORAL_TABLET | Freq: Once | ORAL | Status: AC
  Administered 2015-07-18: 8 mg via ORAL
  Filled 2015-07-18: qty 1

## 2015-07-18 MED ORDER — OXYCODONE-ACETAMINOPHEN 5-325 MG PO TABS: 1.0000 | ORAL_TABLET | ORAL | Status: DC | PRN

## 2015-07-18 NOTE — ED Notes (Signed)
Pt with lower left back pain that radiates down left leg. Has taken 600mg  ibuprofen at 0900 this morning

## 2015-07-18 NOTE — ED Notes (Signed)
Pt verbalized understanding of no driving and to use caution within 4 hours of taking pain meds due to meds cause drowsiness 

## 2015-07-18 NOTE — ED Notes (Signed)
Pt. C/o low back pain x 3 days with radiation to left leg. HX of back pain. Denies any other problems.

## 2015-07-18 NOTE — Discharge Instructions (Signed)
Back Pain, Adult °Back pain is very common in adults. The cause of back pain is rarely dangerous and the pain often gets better over time. The cause of your back pain may not be known. Some common causes of back pain include: °· Strain of the muscles or ligaments supporting the spine. °· Wear and tear (degeneration) of the spinal disks. °· Arthritis. °· Direct injury to the back. °For many people, back pain may return. Since back pain is rarely dangerous, most people can learn to manage this condition on their own. °HOME CARE INSTRUCTIONS °Watch your back pain for any changes. The following actions may help to lessen any discomfort you are feeling: °· Remain active. It is stressful on your back to sit or stand in one place for long periods of time. Do not sit, drive, or stand in one place for more than 30 minutes at a time. Take short walks on even surfaces as soon as you are able. Try to increase the length of time you walk each day. °· Exercise regularly as directed by your health care provider. Exercise helps your back heal faster. It also helps avoid future injury by keeping your muscles strong and flexible. °· Do not stay in bed. Resting more than 1-2 days can delay your recovery. °· Pay attention to your body when you bend and lift. The most comfortable positions are those that put less stress on your recovering back. Always use proper lifting techniques, including: °¨ Bending your knees. °¨ Keeping the load close to your body. °¨ Avoiding twisting. °· Find a comfortable position to sleep. Use a firm mattress and lie on your side with your knees slightly bent. If you lie on your back, put a pillow under your knees. °· Avoid feeling anxious or stressed. Stress increases muscle tension and can worsen back pain. It is important to recognize when you are anxious or stressed and learn ways to manage it, such as with exercise. °· Take medicines only as directed by your health care provider. Over-the-counter  medicines to reduce pain and inflammation are often the most helpful. Your health care provider may prescribe muscle relaxant drugs. These medicines help dull your pain so you can more quickly return to your normal activities and healthy exercise. °· Apply ice to the injured area: °¨ Put ice in a plastic bag. °¨ Place a towel between your skin and the bag. °¨ Leave the ice on for 20 minutes, 2-3 times a day for the first 2-3 days. After that, ice and heat may be alternated to reduce pain and spasms. °· Maintain a healthy weight. Excess weight puts extra stress on your back and makes it difficult to maintain good posture. °SEEK MEDICAL CARE IF: °· You have pain that is not relieved with rest or medicine. °· You have increasing pain going down into the legs or buttocks. °· You have pain that does not improve in one week. °· You have night pain. °· You lose weight. °· You have a fever or chills. °SEEK IMMEDIATE MEDICAL CARE IF:  °· You develop new bowel or bladder control problems. °· You have unusual weakness or numbness in your arms or legs. °· You develop nausea or vomiting. °· You develop abdominal pain. °· You feel faint. °  °This information is not intended to replace advice given to you by your health care provider. Make sure you discuss any questions you have with your health care provider. °  °Document Released: 07/02/2005 Document Revised: 07/23/2014 Document Reviewed: 11/03/2013 °Elsevier Interactive Patient Education ©2016 Elsevier   Inc.  As discussed,  Your pain may be orthopedic (musuloskeletal) in origin, but given that it is burning and radiates around to your lower abdomen, makes me suspicious for possible shingles that has not yet broken out in the rash yet.  Take the oxycodone as needed for pain,  Do not drive within 4 hours of taking this as it will make you drowsy.  Start taking the zovirax if you start breaking out in a rash as discussed.

## 2015-07-18 NOTE — ED Provider Notes (Signed)
CSN: [id number]     Arrival date & time 07/18/15  1152 History  By signing my name below, I, Randa Evens, attest that this documentation has been prepared under the direction and in the presence of [employer], PA-C. Electronically Signed: Randa Evens, ED Scribe. 07/20/2015. 8:14 AM.     Chief Complaint  Patient presents with  . Back Pain   The history is provided by the patient. No language interpreter was used.   HPI Comments: Sherri Reed is a 65 y.o. female who presents to the Emergency Department complaining of constant throbbing-burning low left sided back pain onset 3 days. She states that the pain radiates down to her hip and around to her lower abdomen. She states that the last 3 days the pain has been so severe that she has trouble sleeping. Pt doesn't report any alleviating or worsening symptoms, it is not worsened with movement or improved with positional changes. Pt states that she has tried ibuprofen with no relief. Pt denies Hx of injury or fall. Denies hematuria, frequency, nausea, fever, rash. Pt does report Hx of bulging disc but states that this pain is different. She denies rash.    Past Medical History  Diagnosis Date  . Essential hypertension   . Palpitations   . COPD (chronic obstructive pulmonary disease) [facility])    Past Surgical History  Procedure Laterality Date  . Cesarean section    . Breast biopsy    . Left heart catheterization with coronary angiogram N/A 05/07/2014    Procedure: LEFT HEART CATHETERIZATION WITH CORONARY ANGIOGRAM;  Surgeon: Burnell Blanks, MD;  Location: [facility] CATH LAB;  Service: Cardiovascular;  Laterality: N/A;  . Tonsillectomy     Family History  Problem Relation Age of Onset  . Coronary artery disease Mother   . Emphysema Mother   . Breast cancer Maternal Aunt   . Brain cancer Maternal Grandmother    Social History  Substance Use Topics  . Smoking status: Former Smoker -- 1.50 packs/day for 30 years    Types:  Cigarettes    Quit date: 07/16/2005  . Smokeless tobacco: Never Used  . Alcohol Use: No   OB History    No data available      Review of Systems  Constitutional: Negative for fever.  Gastrointestinal: Negative for nausea.  Genitourinary: Negative for frequency and hematuria.  Musculoskeletal: Positive for back pain.  Skin: Negative for rash.    Allergies  Lisinopril  Home Medications   Prior to Admission medications   Medication Sig Start Date End Date Taking? Authorizing Provider  acetaminophen (TYLENOL) 500 MG tablet Take 500-1,000 mg by mouth as needed for mild pain or moderate pain.     Historical Provider, MD  acyclovir (ZOVIRAX) 800 MG tablet Take 1 tablet (800 mg total) by mouth 5 (five) times daily. 07/18/15   Evalee Jefferson, PA-C  albuterol (PROAIR HFA) 108 (90 BASE) MCG/ACT inhaler Inhale 1 puff into the lungs every 6 (six) hours as needed for wheezing or shortness of breath.    Historical Provider, MD  aspirin 81 MG tablet Take 81 mg by mouth daily.      Historical Provider, MD  Fluticasone-Salmeterol (ADVAIR) 500-50 MCG/DOSE AEPB Inhale 1 puff into the lungs 2 (two) times daily.    Historical Provider, MD  ibuprofen (ADVIL,MOTRIN) 600 MG tablet Take 600 mg by mouth as needed. 02/09/13   John-Adam Bonk, MD  nitroGLYCERIN (NITROSTAT) 0.4 MG SL tablet Place 0.4 mg under the tongue every 5 (  five) minutes as needed for chest pain.    Historical Provider, MD  oxyCODONE-acetaminophen (PERCOCET/ROXICET) 5-325 MG tablet Take 1 tablet by mouth every 4 (four) hours as needed. 07/18/15   Evalee Jefferson, PA-C  oxyCODONE-acetaminophen (PERCOCET/ROXICET) 5-325 MG tablet Take 1 tablet by mouth every 4 (four) hours as needed. 07/18/15   Evalee Jefferson, PA-C  tiotropium (SPIRIVA) 18 MCG inhalation capsule Place 18 mcg into inhaler and inhale daily.    Historical Provider, MD  zolpidem (AMBIEN) 5 MG tablet Take 5 mg by mouth at bedtime as needed for sleep.    Historical Provider, MD   BP 150/63 mmHg   Pulse 86  Temp(Src) 99.1 F (37.3 C) (Tympanic)  Resp 16  Ht 5\' 5"  (1.651 m)  Wt 90.719 kg  BMI 33.28 kg/m2  SpO2 96%   Physical Exam  Constitutional: She is oriented to person, place, and time. She appears well-developed and well-nourished. No distress.  HENT:  Head: Normocephalic and atraumatic.  Eyes: Conjunctivae and EOM are normal.  Neck: Neck supple. No tracheal deviation present.  Cardiovascular: Normal rate.   Pulmonary/Chest: Effort normal. No respiratory distress.  Musculoskeletal: Normal range of motion.       Lumbar back: She exhibits tenderness. She exhibits no bony tenderness, no swelling and no edema.  ttp left lower back radiating around to lateral back.  No erythema, no rash.  Tender with soft touch.  Neurological: She is alert and oriented to person, place, and time.  Skin: Skin is warm and dry.  Psychiatric: She has a normal mood and affect. Her behavior is normal.  Nursing note and vitals reviewed.   ED Course  Procedures (including critical care time) DIAGNOSTIC STUDIES: Oxygen Saturation is 96% on RA, normal by my interpretation.    COORDINATION OF CARE: 2:55 PM-Discussed treatment plan with pt at bedside and pt agreed to plan.     Labs Review Labs Reviewed  URINALYSIS, ROUTINE W REFLEX MICROSCOPIC (NOT AT [facility]) - Abnormal; Notable for the following:    APPearance HAZY (*)    Leukocytes, UA TRACE (*)    All other components within normal limits  URINE MICROSCOPIC-ADD ON - Abnormal; Notable for the following:    Squamous Epithelial / LPF 0-5 (*)    Bacteria, UA RARE (*)    All other components within normal limits    Imaging Review Dg Lumbar Spine Complete  07/18/2015  CLINICAL DATA:  Patient with left-sided back pain radiating down the left leg for 3 days. No known injury. EXAM: LUMBAR SPINE - COMPLETE 4+ VIEW COMPARISON:  Lumbar spine radiograph 04/01/2012. FINDINGS: Normal anatomic alignment. Preservation of the vertebral body and  intervertebral disc space heights. Mild lower lumbar spine degenerative disc disease. Lower lumbar spine facet degenerative changes. SI joints are unremarkable. Large amount of stool within the cecum and ascending colon. Lung bases are clear. IMPRESSION: Lower lumbar spine degenerative facet disease. Mild degenerative disc disease lower lumbar spine. Electronically Signed   By: Lovey Newcomer M.D.   On: 07/18/2015 15:47      EKG Interpretation None      MDM   Final diagnoses:  Left-sided low back pain without sciatica   Pt with low back pain with radiculopathy around her trunk.  No rash, no erythema, no worsened with movement, but constant.  Discussed possible early shingles with patient, although this could be a new radiculopathy associated with her chronic back pain.  She was prescribed oxycodone for pain relief, also prescribed acyclovir with instructions to  hold this prescription, get filled only if she develops any sign of rash.  Pt agrees and understands.  Advised f/u with pcp within 1 week if not improved.  Of note, pt had shingles vaccine several years ago.   I personally performed the services described in this documentation, which was scribed in my presence. The recorded information has been reviewed and is accurate.     Evalee Jefferson, PA-C 07/20/15 [id number]  Dorie Rank, MD 07/21/15 [phone]

## 2015-07-18 NOTE — ED Notes (Signed)
J. Idol, PA at bedside. 

## 2015-07-20 DIAGNOSIS — M25552 Pain in left hip: Secondary | ICD-10-CM | POA: Diagnosis not present

## 2015-09-21 DIAGNOSIS — J09X2 Influenza due to identified novel influenza A virus with other respiratory manifestations: Secondary | ICD-10-CM | POA: Diagnosis not present

## 2015-10-17 DIAGNOSIS — J441 Chronic obstructive pulmonary disease with (acute) exacerbation: Secondary | ICD-10-CM | POA: Diagnosis not present

## 2015-10-17 DIAGNOSIS — R05 Cough: Secondary | ICD-10-CM | POA: Diagnosis not present

## 2015-10-17 DIAGNOSIS — R062 Wheezing: Secondary | ICD-10-CM | POA: Diagnosis not present

## 2015-12-14 DIAGNOSIS — R300 Dysuria: Secondary | ICD-10-CM | POA: Diagnosis not present

## 2015-12-14 DIAGNOSIS — R1030 Lower abdominal pain, unspecified: Secondary | ICD-10-CM | POA: Diagnosis not present

## 2016-02-16 DIAGNOSIS — Z1231 Encounter for screening mammogram for malignant neoplasm of breast: Secondary | ICD-10-CM | POA: Diagnosis not present

## 2016-04-11 DIAGNOSIS — Z6834 Body mass index (BMI) 34.0-34.9, adult: Secondary | ICD-10-CM | POA: Diagnosis not present

## 2016-04-11 DIAGNOSIS — Z1389 Encounter for screening for other disorder: Secondary | ICD-10-CM | POA: Diagnosis not present

## 2016-04-11 DIAGNOSIS — R350 Frequency of micturition: Secondary | ICD-10-CM | POA: Diagnosis not present

## 2016-04-11 DIAGNOSIS — R1030 Lower abdominal pain, unspecified: Secondary | ICD-10-CM | POA: Diagnosis not present

## 2016-04-19 DIAGNOSIS — D259 Leiomyoma of uterus, unspecified: Secondary | ICD-10-CM | POA: Diagnosis not present

## 2016-04-19 DIAGNOSIS — R1030 Lower abdominal pain, unspecified: Secondary | ICD-10-CM | POA: Diagnosis not present

## 2016-05-06 IMAGING — DX DG LUMBAR SPINE COMPLETE 4+V
5 series · 5 of 5 positions shown · non-contrast
Comparison: Lumbar spine radiograph 04/01/2012.

CLINICAL DATA: Patient with left-sided back pain radiating down the
left leg for 3 days. No known injury.

EXAM:
LUMBAR SPINE - COMPLETE 4+ VIEW

[l-spine ap]
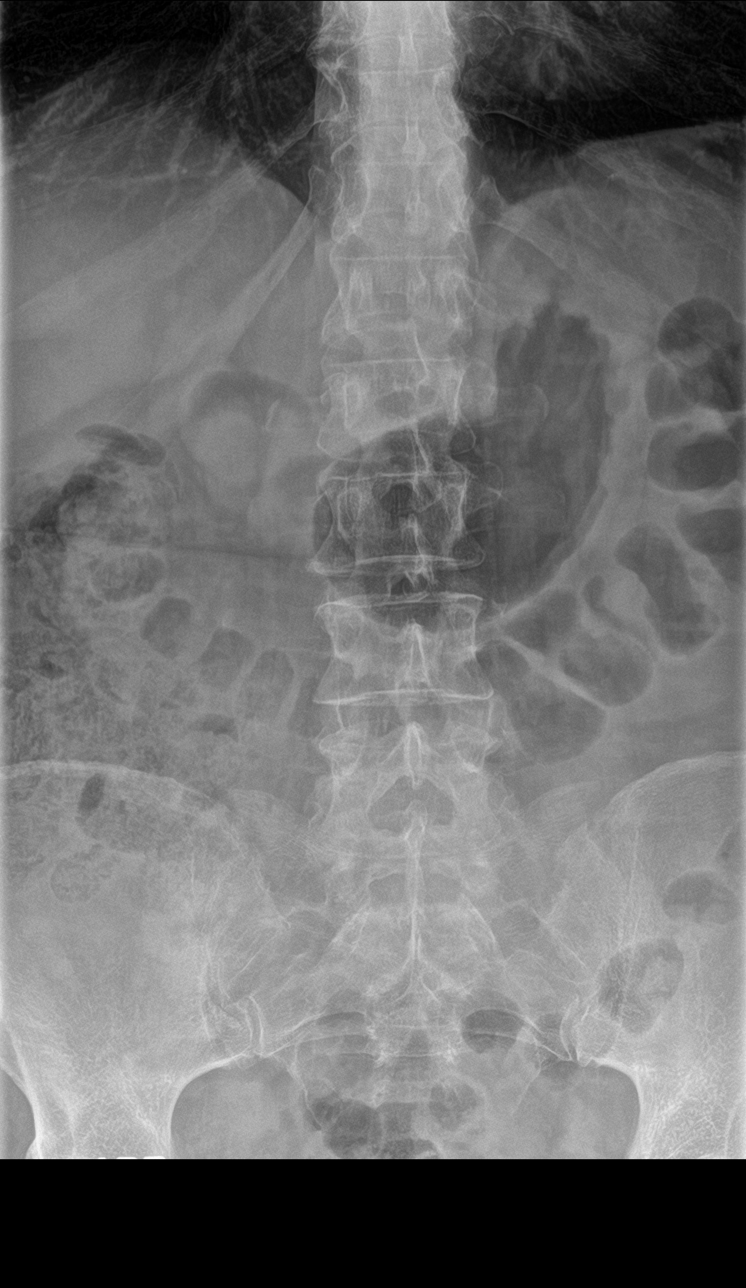

[l-spine obl (1 of 2)]
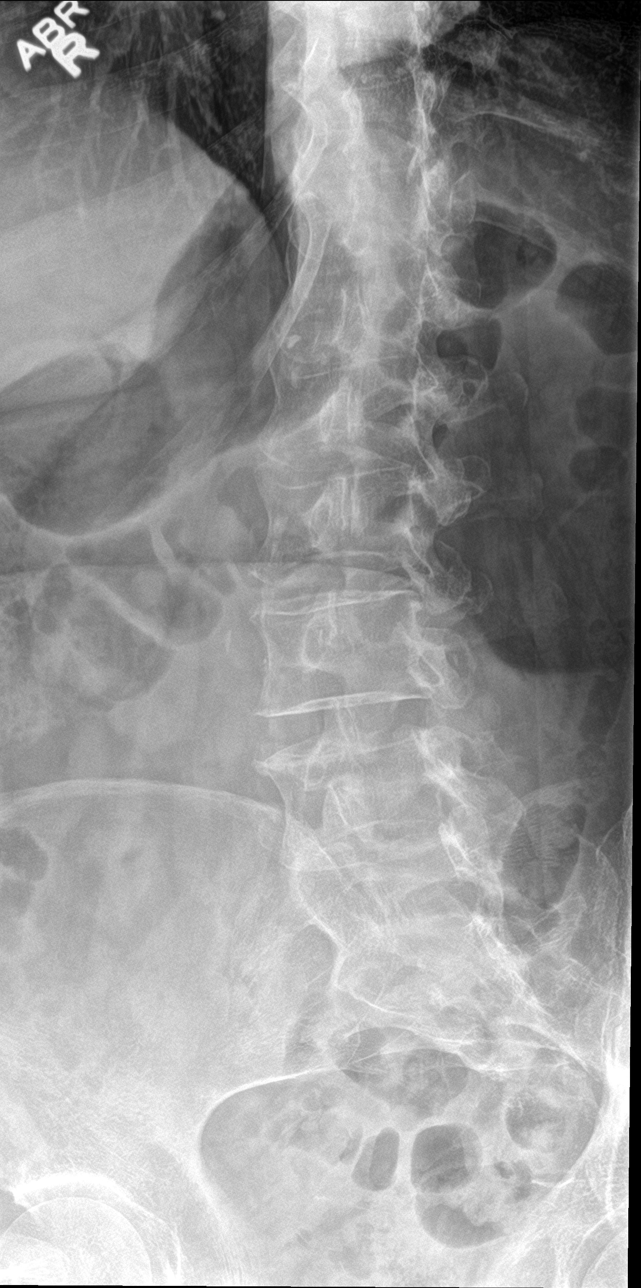

[l-spine obl (2 of 2)]
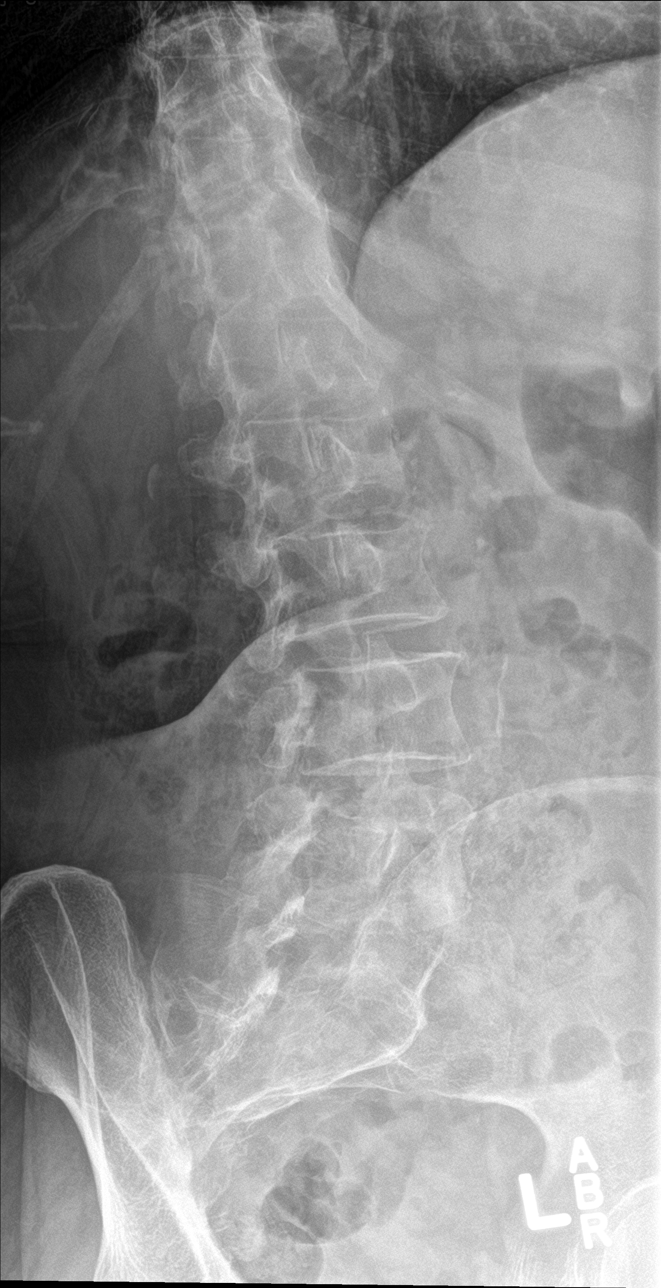

[l-spine lat]
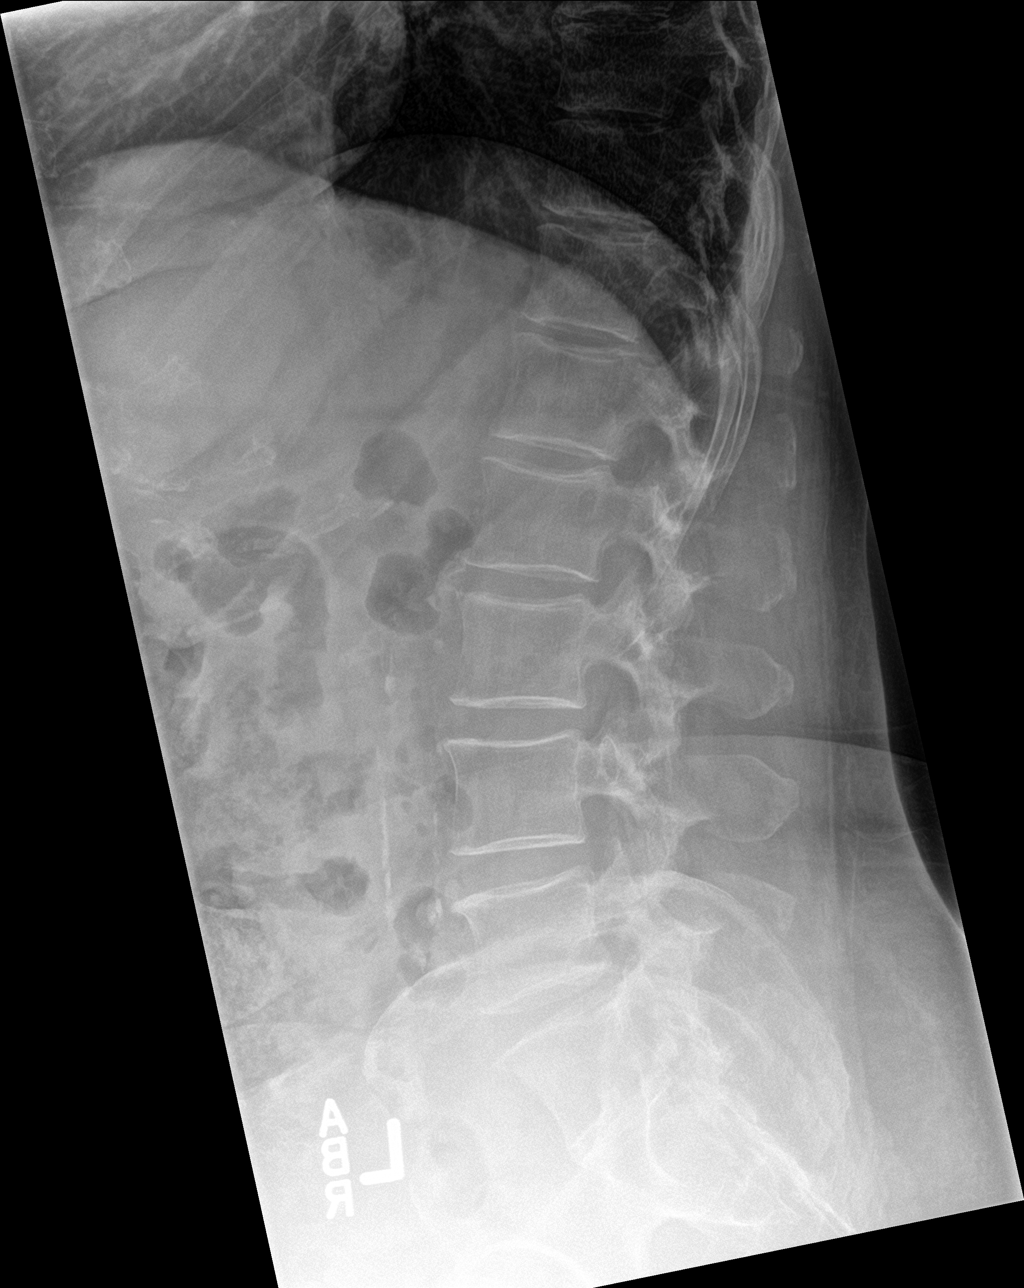

[l-spine spot]
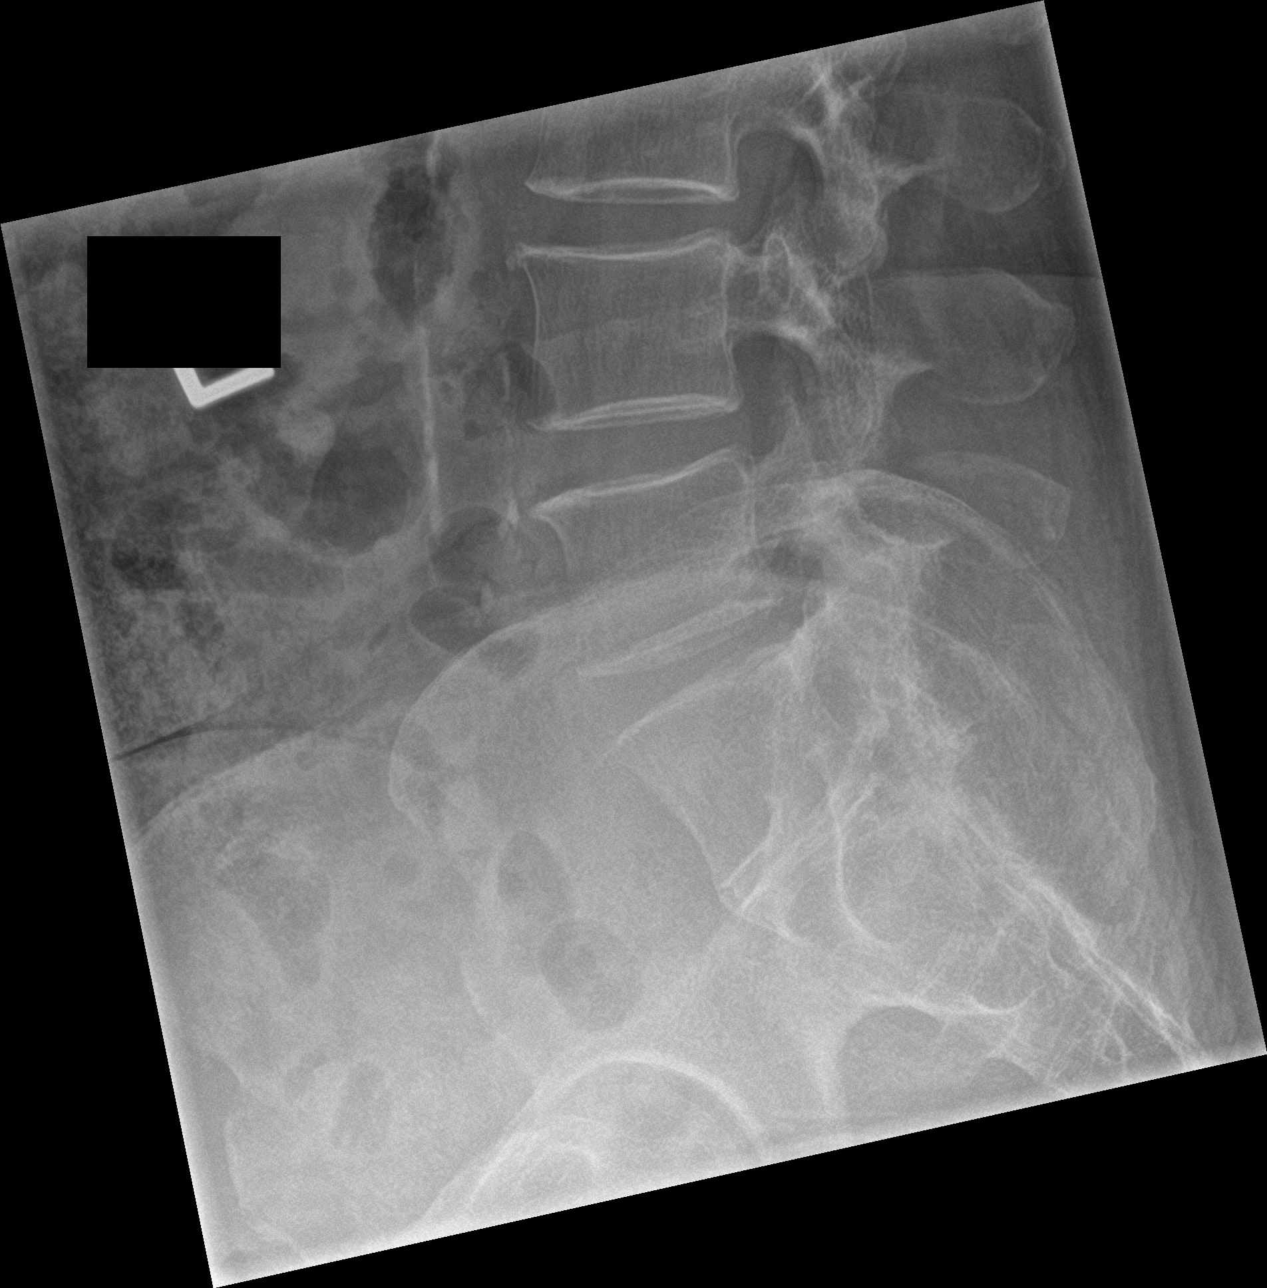

[5 of 5 positions shown; findings below may reference images not displayed]

FINDINGS: Normal anatomic alignment. Preservation of the vertebral body and
intervertebral disc space heights. Mild lower lumbar spine
degenerative disc disease. Lower lumbar spine facet degenerative
changes. SI joints are unremarkable. Large amount of stool within
the cecum and ascending colon. Lung bases are clear.
IMPRESSION: Lower lumbar spine degenerative facet disease.

Mild degenerative disc disease lower lumbar spine.

## 2016-05-17 DIAGNOSIS — J441 Chronic obstructive pulmonary disease with (acute) exacerbation: Secondary | ICD-10-CM | POA: Diagnosis not present

## 2016-05-17 DIAGNOSIS — Z6834 Body mass index (BMI) 34.0-34.9, adult: Secondary | ICD-10-CM | POA: Diagnosis not present

## 2016-05-24 DIAGNOSIS — J189 Pneumonia, unspecified organism: Secondary | ICD-10-CM | POA: Diagnosis not present

## 2016-05-24 DIAGNOSIS — Z6834 Body mass index (BMI) 34.0-34.9, adult: Secondary | ICD-10-CM | POA: Diagnosis not present

## 2016-05-24 DIAGNOSIS — J441 Chronic obstructive pulmonary disease with (acute) exacerbation: Secondary | ICD-10-CM | POA: Diagnosis not present

## 2016-06-06 DIAGNOSIS — Z6835 Body mass index (BMI) 35.0-35.9, adult: Secondary | ICD-10-CM | POA: Diagnosis not present

## 2016-06-06 DIAGNOSIS — J441 Chronic obstructive pulmonary disease with (acute) exacerbation: Secondary | ICD-10-CM | POA: Diagnosis not present

## 2016-07-31 DIAGNOSIS — H35033 Hypertensive retinopathy, bilateral: Secondary | ICD-10-CM | POA: Diagnosis not present

## 2016-07-31 DIAGNOSIS — H16223 Keratoconjunctivitis sicca, not specified as Sjogren's, bilateral: Secondary | ICD-10-CM | POA: Diagnosis not present

## 2016-07-31 DIAGNOSIS — H3540 Unspecified peripheral retinal degeneration: Secondary | ICD-10-CM | POA: Diagnosis not present

## 2016-07-31 DIAGNOSIS — H04123 Dry eye syndrome of bilateral lacrimal glands: Secondary | ICD-10-CM | POA: Diagnosis not present

## 2016-07-31 DIAGNOSIS — H25013 Cortical age-related cataract, bilateral: Secondary | ICD-10-CM | POA: Diagnosis not present

## 2016-07-31 DIAGNOSIS — H2513 Age-related nuclear cataract, bilateral: Secondary | ICD-10-CM | POA: Diagnosis not present

## 2016-07-31 DIAGNOSIS — I10 Essential (primary) hypertension: Secondary | ICD-10-CM | POA: Diagnosis not present

## 2016-07-31 DIAGNOSIS — H25813 Combined forms of age-related cataract, bilateral: Secondary | ICD-10-CM | POA: Diagnosis not present

## 2016-07-31 DIAGNOSIS — H524 Presbyopia: Secondary | ICD-10-CM | POA: Diagnosis not present

## 2016-08-06 DIAGNOSIS — J019 Acute sinusitis, unspecified: Secondary | ICD-10-CM | POA: Diagnosis not present

## 2016-11-12 DIAGNOSIS — R300 Dysuria: Secondary | ICD-10-CM | POA: Diagnosis not present

## 2016-11-12 DIAGNOSIS — R350 Frequency of micturition: Secondary | ICD-10-CM | POA: Diagnosis not present

## 2016-11-12 DIAGNOSIS — Z6835 Body mass index (BMI) 35.0-35.9, adult: Secondary | ICD-10-CM | POA: Diagnosis not present

## 2016-11-12 DIAGNOSIS — R1030 Lower abdominal pain, unspecified: Secondary | ICD-10-CM | POA: Diagnosis not present

## 2016-11-15 DIAGNOSIS — Z6835 Body mass index (BMI) 35.0-35.9, adult: Secondary | ICD-10-CM | POA: Diagnosis not present

## 2016-11-15 DIAGNOSIS — R600 Localized edema: Secondary | ICD-10-CM | POA: Diagnosis not present

## 2016-11-15 DIAGNOSIS — J449 Chronic obstructive pulmonary disease, unspecified: Secondary | ICD-10-CM | POA: Diagnosis not present

## 2016-11-15 DIAGNOSIS — R0902 Hypoxemia: Secondary | ICD-10-CM | POA: Diagnosis not present

## 2016-11-15 DIAGNOSIS — R05 Cough: Secondary | ICD-10-CM | POA: Diagnosis not present

## 2016-11-15 DIAGNOSIS — R911 Solitary pulmonary nodule: Secondary | ICD-10-CM | POA: Diagnosis not present

## 2016-11-15 DIAGNOSIS — R0600 Dyspnea, unspecified: Secondary | ICD-10-CM | POA: Diagnosis not present

## 2016-11-15 DIAGNOSIS — R0602 Shortness of breath: Secondary | ICD-10-CM | POA: Diagnosis not present

## 2016-11-15 DIAGNOSIS — R609 Edema, unspecified: Secondary | ICD-10-CM | POA: Diagnosis not present

## 2016-11-23 DIAGNOSIS — Z87891 Personal history of nicotine dependence: Secondary | ICD-10-CM | POA: Diagnosis not present

## 2016-11-23 DIAGNOSIS — R600 Localized edema: Secondary | ICD-10-CM | POA: Diagnosis not present

## 2016-11-23 DIAGNOSIS — R918 Other nonspecific abnormal finding of lung field: Secondary | ICD-10-CM | POA: Diagnosis not present

## 2016-11-23 DIAGNOSIS — I071 Rheumatic tricuspid insufficiency: Secondary | ICD-10-CM | POA: Diagnosis not present

## 2016-11-23 DIAGNOSIS — I340 Nonrheumatic mitral (valve) insufficiency: Secondary | ICD-10-CM | POA: Diagnosis not present

## 2016-11-23 DIAGNOSIS — R0602 Shortness of breath: Secondary | ICD-10-CM | POA: Diagnosis not present

## 2016-11-23 DIAGNOSIS — R590 Localized enlarged lymph nodes: Secondary | ICD-10-CM | POA: Diagnosis not present

## 2016-12-05 DIAGNOSIS — H43812 Vitreous degeneration, left eye: Secondary | ICD-10-CM | POA: Diagnosis not present

## 2016-12-07 DIAGNOSIS — G4761 Periodic limb movement disorder: Secondary | ICD-10-CM | POA: Diagnosis not present

## 2016-12-07 DIAGNOSIS — R0902 Hypoxemia: Secondary | ICD-10-CM | POA: Diagnosis not present

## 2016-12-07 DIAGNOSIS — J449 Chronic obstructive pulmonary disease, unspecified: Secondary | ICD-10-CM | POA: Diagnosis not present

## 2017-01-03 DIAGNOSIS — J449 Chronic obstructive pulmonary disease, unspecified: Secondary | ICD-10-CM | POA: Diagnosis not present

## 2017-01-03 DIAGNOSIS — G4761 Periodic limb movement disorder: Secondary | ICD-10-CM | POA: Diagnosis not present

## 2017-01-03 DIAGNOSIS — G2581 Restless legs syndrome: Secondary | ICD-10-CM | POA: Diagnosis not present

## 2017-01-03 DIAGNOSIS — Z6835 Body mass index (BMI) 35.0-35.9, adult: Secondary | ICD-10-CM | POA: Diagnosis not present

## 2017-01-03 DIAGNOSIS — G4700 Insomnia, unspecified: Secondary | ICD-10-CM | POA: Diagnosis not present

## 2017-01-03 DIAGNOSIS — R5383 Other fatigue: Secondary | ICD-10-CM | POA: Diagnosis not present

## 2017-01-17 DIAGNOSIS — Z6835 Body mass index (BMI) 35.0-35.9, adult: Secondary | ICD-10-CM | POA: Diagnosis not present

## 2017-01-17 DIAGNOSIS — R1011 Right upper quadrant pain: Secondary | ICD-10-CM | POA: Diagnosis not present

## 2017-01-17 DIAGNOSIS — G4761 Periodic limb movement disorder: Secondary | ICD-10-CM | POA: Diagnosis not present

## 2017-01-17 DIAGNOSIS — R05 Cough: Secondary | ICD-10-CM | POA: Diagnosis not present

## 2017-01-17 DIAGNOSIS — J449 Chronic obstructive pulmonary disease, unspecified: Secondary | ICD-10-CM | POA: Diagnosis not present

## 2017-01-17 DIAGNOSIS — R600 Localized edema: Secondary | ICD-10-CM | POA: Diagnosis not present

## 2017-01-17 DIAGNOSIS — R911 Solitary pulmonary nodule: Secondary | ICD-10-CM | POA: Diagnosis not present

## 2017-01-17 DIAGNOSIS — R0902 Hypoxemia: Secondary | ICD-10-CM | POA: Diagnosis not present

## 2017-01-18 DIAGNOSIS — R1011 Right upper quadrant pain: Secondary | ICD-10-CM | POA: Diagnosis not present

## 2017-01-18 DIAGNOSIS — K760 Fatty (change of) liver, not elsewhere classified: Secondary | ICD-10-CM | POA: Diagnosis not present

## 2017-01-21 DIAGNOSIS — J449 Chronic obstructive pulmonary disease, unspecified: Secondary | ICD-10-CM | POA: Diagnosis not present

## 2017-02-21 DIAGNOSIS — J449 Chronic obstructive pulmonary disease, unspecified: Secondary | ICD-10-CM | POA: Diagnosis not present

## 2017-03-24 DIAGNOSIS — J449 Chronic obstructive pulmonary disease, unspecified: Secondary | ICD-10-CM | POA: Diagnosis not present

## 2017-04-01 DIAGNOSIS — J069 Acute upper respiratory infection, unspecified: Secondary | ICD-10-CM | POA: Diagnosis not present

## 2017-04-23 DIAGNOSIS — J449 Chronic obstructive pulmonary disease, unspecified: Secondary | ICD-10-CM | POA: Diagnosis not present

## 2017-05-02 DIAGNOSIS — M79604 Pain in right leg: Secondary | ICD-10-CM | POA: Diagnosis not present

## 2017-05-02 DIAGNOSIS — Z23 Encounter for immunization: Secondary | ICD-10-CM | POA: Diagnosis not present

## 2017-05-02 DIAGNOSIS — S8011XA Contusion of right lower leg, initial encounter: Secondary | ICD-10-CM | POA: Diagnosis not present

## 2017-05-10 DIAGNOSIS — Z6834 Body mass index (BMI) 34.0-34.9, adult: Secondary | ICD-10-CM | POA: Diagnosis not present

## 2017-05-10 DIAGNOSIS — M79604 Pain in right leg: Secondary | ICD-10-CM | POA: Diagnosis not present

## 2017-05-10 DIAGNOSIS — S8011XA Contusion of right lower leg, initial encounter: Secondary | ICD-10-CM | POA: Diagnosis not present

## 2017-05-24 DIAGNOSIS — J449 Chronic obstructive pulmonary disease, unspecified: Secondary | ICD-10-CM | POA: Diagnosis not present

## 2017-06-23 DIAGNOSIS — J449 Chronic obstructive pulmonary disease, unspecified: Secondary | ICD-10-CM | POA: Diagnosis not present

## 2017-07-24 DIAGNOSIS — J449 Chronic obstructive pulmonary disease, unspecified: Secondary | ICD-10-CM | POA: Diagnosis not present

## 2017-07-25 DIAGNOSIS — J449 Chronic obstructive pulmonary disease, unspecified: Secondary | ICD-10-CM | POA: Diagnosis not present

## 2017-08-24 DIAGNOSIS — J449 Chronic obstructive pulmonary disease, unspecified: Secondary | ICD-10-CM | POA: Diagnosis not present

## 2017-08-27 DIAGNOSIS — H524 Presbyopia: Secondary | ICD-10-CM | POA: Diagnosis not present

## 2017-09-21 DIAGNOSIS — J449 Chronic obstructive pulmonary disease, unspecified: Secondary | ICD-10-CM | POA: Diagnosis not present

## 2017-10-22 DIAGNOSIS — J449 Chronic obstructive pulmonary disease, unspecified: Secondary | ICD-10-CM | POA: Diagnosis not present

## 2017-11-11 DIAGNOSIS — R0902 Hypoxemia: Secondary | ICD-10-CM | POA: Diagnosis not present

## 2017-11-11 DIAGNOSIS — R5383 Other fatigue: Secondary | ICD-10-CM | POA: Diagnosis not present

## 2017-11-11 DIAGNOSIS — Z6834 Body mass index (BMI) 34.0-34.9, adult: Secondary | ICD-10-CM | POA: Diagnosis not present

## 2017-11-11 DIAGNOSIS — J449 Chronic obstructive pulmonary disease, unspecified: Secondary | ICD-10-CM | POA: Diagnosis not present

## 2017-11-11 DIAGNOSIS — G4761 Periodic limb movement disorder: Secondary | ICD-10-CM | POA: Diagnosis not present

## 2017-11-21 DIAGNOSIS — J449 Chronic obstructive pulmonary disease, unspecified: Secondary | ICD-10-CM | POA: Diagnosis not present

## 2017-12-07 DIAGNOSIS — J449 Chronic obstructive pulmonary disease, unspecified: Secondary | ICD-10-CM | POA: Diagnosis not present

## 2017-12-07 DIAGNOSIS — R0902 Hypoxemia: Secondary | ICD-10-CM | POA: Diagnosis not present

## 2017-12-07 DIAGNOSIS — Z6834 Body mass index (BMI) 34.0-34.9, adult: Secondary | ICD-10-CM | POA: Diagnosis not present

## 2017-12-22 DIAGNOSIS — J449 Chronic obstructive pulmonary disease, unspecified: Secondary | ICD-10-CM | POA: Diagnosis not present

## 2017-12-30 DIAGNOSIS — G2581 Restless legs syndrome: Secondary | ICD-10-CM | POA: Diagnosis not present

## 2017-12-30 DIAGNOSIS — J9611 Chronic respiratory failure with hypoxia: Secondary | ICD-10-CM | POA: Diagnosis not present

## 2017-12-30 DIAGNOSIS — J449 Chronic obstructive pulmonary disease, unspecified: Secondary | ICD-10-CM | POA: Diagnosis not present

## 2018-01-21 DIAGNOSIS — J449 Chronic obstructive pulmonary disease, unspecified: Secondary | ICD-10-CM | POA: Diagnosis not present

## 2018-01-23 DIAGNOSIS — N200 Calculus of kidney: Secondary | ICD-10-CM | POA: Diagnosis not present

## 2018-01-23 DIAGNOSIS — Z6834 Body mass index (BMI) 34.0-34.9, adult: Secondary | ICD-10-CM | POA: Diagnosis not present

## 2018-02-18 DIAGNOSIS — J441 Chronic obstructive pulmonary disease with (acute) exacerbation: Secondary | ICD-10-CM | POA: Diagnosis not present

## 2018-02-18 DIAGNOSIS — J439 Emphysema, unspecified: Secondary | ICD-10-CM | POA: Diagnosis not present

## 2018-02-18 DIAGNOSIS — J42 Unspecified chronic bronchitis: Secondary | ICD-10-CM | POA: Diagnosis not present

## 2018-02-18 DIAGNOSIS — R05 Cough: Secondary | ICD-10-CM | POA: Diagnosis not present

## 2018-02-18 DIAGNOSIS — R0600 Dyspnea, unspecified: Secondary | ICD-10-CM | POA: Diagnosis not present

## 2018-02-18 DIAGNOSIS — I700 Atherosclerosis of aorta: Secondary | ICD-10-CM | POA: Diagnosis not present

## 2018-02-18 DIAGNOSIS — R7989 Other specified abnormal findings of blood chemistry: Secondary | ICD-10-CM | POA: Diagnosis not present

## 2018-02-18 DIAGNOSIS — R0602 Shortness of breath: Secondary | ICD-10-CM | POA: Diagnosis not present

## 2018-02-21 DIAGNOSIS — J449 Chronic obstructive pulmonary disease, unspecified: Secondary | ICD-10-CM | POA: Diagnosis not present

## 2018-02-27 DIAGNOSIS — J449 Chronic obstructive pulmonary disease, unspecified: Secondary | ICD-10-CM | POA: Diagnosis not present

## 2018-03-24 DIAGNOSIS — J449 Chronic obstructive pulmonary disease, unspecified: Secondary | ICD-10-CM | POA: Diagnosis not present

## 2018-03-31 DIAGNOSIS — M19071 Primary osteoarthritis, right ankle and foot: Secondary | ICD-10-CM | POA: Diagnosis not present

## 2018-03-31 DIAGNOSIS — J449 Chronic obstructive pulmonary disease, unspecified: Secondary | ICD-10-CM | POA: Diagnosis not present

## 2018-03-31 DIAGNOSIS — Z6835 Body mass index (BMI) 35.0-35.9, adult: Secondary | ICD-10-CM | POA: Diagnosis not present

## 2018-04-23 DIAGNOSIS — J449 Chronic obstructive pulmonary disease, unspecified: Secondary | ICD-10-CM | POA: Diagnosis not present

## 2018-05-23 DIAGNOSIS — Z23 Encounter for immunization: Secondary | ICD-10-CM | POA: Diagnosis not present

## 2018-05-24 DIAGNOSIS — J449 Chronic obstructive pulmonary disease, unspecified: Secondary | ICD-10-CM | POA: Diagnosis not present

## 2018-06-23 DIAGNOSIS — J449 Chronic obstructive pulmonary disease, unspecified: Secondary | ICD-10-CM | POA: Diagnosis not present

## 2018-07-15 DIAGNOSIS — J0100 Acute maxillary sinusitis, unspecified: Secondary | ICD-10-CM | POA: Diagnosis not present

## 2018-07-15 DIAGNOSIS — J209 Acute bronchitis, unspecified: Secondary | ICD-10-CM | POA: Diagnosis not present

## 2018-07-24 DIAGNOSIS — J449 Chronic obstructive pulmonary disease, unspecified: Secondary | ICD-10-CM | POA: Diagnosis not present

## 2018-07-25 DIAGNOSIS — J449 Chronic obstructive pulmonary disease, unspecified: Secondary | ICD-10-CM | POA: Diagnosis not present

## 2018-08-07 DIAGNOSIS — J449 Chronic obstructive pulmonary disease, unspecified: Secondary | ICD-10-CM | POA: Diagnosis not present

## 2018-08-07 DIAGNOSIS — Z6834 Body mass index (BMI) 34.0-34.9, adult: Secondary | ICD-10-CM | POA: Diagnosis not present

## 2018-08-08 DIAGNOSIS — J449 Chronic obstructive pulmonary disease, unspecified: Secondary | ICD-10-CM | POA: Diagnosis not present

## 2018-09-08 DIAGNOSIS — J449 Chronic obstructive pulmonary disease, unspecified: Secondary | ICD-10-CM | POA: Diagnosis not present

## 2018-10-02 DIAGNOSIS — R05 Cough: Secondary | ICD-10-CM | POA: Diagnosis not present

## 2018-10-02 DIAGNOSIS — J111 Influenza due to unidentified influenza virus with other respiratory manifestations: Secondary | ICD-10-CM | POA: Diagnosis not present

## 2018-10-02 DIAGNOSIS — R509 Fever, unspecified: Secondary | ICD-10-CM | POA: Diagnosis not present

## 2018-10-06 DIAGNOSIS — R509 Fever, unspecified: Secondary | ICD-10-CM | POA: Diagnosis not present

## 2018-10-06 DIAGNOSIS — J441 Chronic obstructive pulmonary disease with (acute) exacerbation: Secondary | ICD-10-CM | POA: Diagnosis not present

## 2018-10-06 DIAGNOSIS — J111 Influenza due to unidentified influenza virus with other respiratory manifestations: Secondary | ICD-10-CM | POA: Diagnosis not present

## 2018-10-06 DIAGNOSIS — R05 Cough: Secondary | ICD-10-CM | POA: Diagnosis not present

## 2018-10-07 DIAGNOSIS — J449 Chronic obstructive pulmonary disease, unspecified: Secondary | ICD-10-CM | POA: Diagnosis not present

## 2018-10-28 DIAGNOSIS — I959 Hypotension, unspecified: Secondary | ICD-10-CM | POA: Diagnosis not present

## 2018-10-28 DIAGNOSIS — R0902 Hypoxemia: Secondary | ICD-10-CM | POA: Diagnosis not present

## 2018-10-28 DIAGNOSIS — J441 Chronic obstructive pulmonary disease with (acute) exacerbation: Secondary | ICD-10-CM | POA: Diagnosis not present

## 2018-10-28 DIAGNOSIS — R5383 Other fatigue: Secondary | ICD-10-CM | POA: Diagnosis not present

## 2018-10-28 DIAGNOSIS — E785 Hyperlipidemia, unspecified: Secondary | ICD-10-CM | POA: Diagnosis not present

## 2018-10-30 DIAGNOSIS — E785 Hyperlipidemia, unspecified: Secondary | ICD-10-CM | POA: Diagnosis not present

## 2018-10-30 DIAGNOSIS — Z0001 Encounter for general adult medical examination with abnormal findings: Secondary | ICD-10-CM | POA: Diagnosis not present

## 2018-10-30 DIAGNOSIS — Z6833 Body mass index (BMI) 33.0-33.9, adult: Secondary | ICD-10-CM | POA: Diagnosis not present

## 2018-10-30 DIAGNOSIS — G4761 Periodic limb movement disorder: Secondary | ICD-10-CM | POA: Diagnosis not present

## 2018-10-30 DIAGNOSIS — J449 Chronic obstructive pulmonary disease, unspecified: Secondary | ICD-10-CM | POA: Diagnosis not present

## 2018-10-30 DIAGNOSIS — G4700 Insomnia, unspecified: Secondary | ICD-10-CM | POA: Diagnosis not present

## 2018-11-07 DIAGNOSIS — J449 Chronic obstructive pulmonary disease, unspecified: Secondary | ICD-10-CM | POA: Diagnosis not present

## 2018-12-07 DIAGNOSIS — J449 Chronic obstructive pulmonary disease, unspecified: Secondary | ICD-10-CM | POA: Diagnosis not present

## 2019-01-07 DIAGNOSIS — J449 Chronic obstructive pulmonary disease, unspecified: Secondary | ICD-10-CM | POA: Diagnosis not present

## 2019-02-06 DIAGNOSIS — J449 Chronic obstructive pulmonary disease, unspecified: Secondary | ICD-10-CM | POA: Diagnosis not present

## 2019-03-09 DIAGNOSIS — J449 Chronic obstructive pulmonary disease, unspecified: Secondary | ICD-10-CM | POA: Diagnosis not present

## 2019-03-17 DIAGNOSIS — Z1231 Encounter for screening mammogram for malignant neoplasm of breast: Secondary | ICD-10-CM | POA: Diagnosis not present

## 2019-04-09 DIAGNOSIS — J449 Chronic obstructive pulmonary disease, unspecified: Secondary | ICD-10-CM | POA: Diagnosis not present

## 2019-04-24 DIAGNOSIS — E785 Hyperlipidemia, unspecified: Secondary | ICD-10-CM | POA: Diagnosis not present

## 2019-04-24 DIAGNOSIS — I959 Hypotension, unspecified: Secondary | ICD-10-CM | POA: Diagnosis not present

## 2019-04-24 DIAGNOSIS — R5383 Other fatigue: Secondary | ICD-10-CM | POA: Diagnosis not present

## 2019-04-24 DIAGNOSIS — R0902 Hypoxemia: Secondary | ICD-10-CM | POA: Diagnosis not present

## 2019-04-24 DIAGNOSIS — J441 Chronic obstructive pulmonary disease with (acute) exacerbation: Secondary | ICD-10-CM | POA: Diagnosis not present

## 2019-04-29 DIAGNOSIS — Z23 Encounter for immunization: Secondary | ICD-10-CM | POA: Diagnosis not present

## 2019-04-29 DIAGNOSIS — J449 Chronic obstructive pulmonary disease, unspecified: Secondary | ICD-10-CM | POA: Diagnosis not present

## 2019-04-29 DIAGNOSIS — E785 Hyperlipidemia, unspecified: Secondary | ICD-10-CM | POA: Diagnosis not present

## 2019-04-29 DIAGNOSIS — G4700 Insomnia, unspecified: Secondary | ICD-10-CM | POA: Diagnosis not present

## 2019-04-29 DIAGNOSIS — G4761 Periodic limb movement disorder: Secondary | ICD-10-CM | POA: Diagnosis not present

## 2019-05-06 DIAGNOSIS — Z1211 Encounter for screening for malignant neoplasm of colon: Secondary | ICD-10-CM | POA: Diagnosis not present

## 2019-05-09 DIAGNOSIS — J449 Chronic obstructive pulmonary disease, unspecified: Secondary | ICD-10-CM | POA: Diagnosis not present

## 2019-06-09 DIAGNOSIS — J449 Chronic obstructive pulmonary disease, unspecified: Secondary | ICD-10-CM | POA: Diagnosis not present

## 2019-07-09 DIAGNOSIS — J449 Chronic obstructive pulmonary disease, unspecified: Secondary | ICD-10-CM | POA: Diagnosis not present

## 2019-07-14 DIAGNOSIS — Z01818 Encounter for other preprocedural examination: Secondary | ICD-10-CM | POA: Diagnosis not present

## 2019-07-16 DIAGNOSIS — K5730 Diverticulosis of large intestine without perforation or abscess without bleeding: Secondary | ICD-10-CM | POA: Diagnosis not present

## 2019-07-16 DIAGNOSIS — K635 Polyp of colon: Secondary | ICD-10-CM | POA: Diagnosis not present

## 2019-07-16 DIAGNOSIS — J449 Chronic obstructive pulmonary disease, unspecified: Secondary | ICD-10-CM | POA: Diagnosis not present

## 2019-07-16 DIAGNOSIS — Z1211 Encounter for screening for malignant neoplasm of colon: Secondary | ICD-10-CM | POA: Diagnosis not present

## 2019-07-16 DIAGNOSIS — D128 Benign neoplasm of rectum: Secondary | ICD-10-CM | POA: Diagnosis not present

## 2019-07-16 DIAGNOSIS — K621 Rectal polyp: Secondary | ICD-10-CM | POA: Diagnosis not present

## 2019-08-05 DIAGNOSIS — D126 Benign neoplasm of colon, unspecified: Secondary | ICD-10-CM | POA: Diagnosis not present

## 2019-08-09 DIAGNOSIS — J449 Chronic obstructive pulmonary disease, unspecified: Secondary | ICD-10-CM | POA: Diagnosis not present

## 2019-09-09 DIAGNOSIS — J449 Chronic obstructive pulmonary disease, unspecified: Secondary | ICD-10-CM | POA: Diagnosis not present

## 2019-10-07 DIAGNOSIS — J449 Chronic obstructive pulmonary disease, unspecified: Secondary | ICD-10-CM | POA: Diagnosis not present

## 2019-10-26 DIAGNOSIS — R5383 Other fatigue: Secondary | ICD-10-CM | POA: Diagnosis not present

## 2019-10-26 DIAGNOSIS — E785 Hyperlipidemia, unspecified: Secondary | ICD-10-CM | POA: Diagnosis not present

## 2019-10-26 DIAGNOSIS — J441 Chronic obstructive pulmonary disease with (acute) exacerbation: Secondary | ICD-10-CM | POA: Diagnosis not present

## 2019-10-26 DIAGNOSIS — H524 Presbyopia: Secondary | ICD-10-CM | POA: Diagnosis not present

## 2019-10-30 DIAGNOSIS — Z23 Encounter for immunization: Secondary | ICD-10-CM | POA: Diagnosis not present

## 2019-10-30 DIAGNOSIS — E785 Hyperlipidemia, unspecified: Secondary | ICD-10-CM | POA: Diagnosis not present

## 2019-10-30 DIAGNOSIS — G4761 Periodic limb movement disorder: Secondary | ICD-10-CM | POA: Diagnosis not present

## 2019-10-30 DIAGNOSIS — G4700 Insomnia, unspecified: Secondary | ICD-10-CM | POA: Diagnosis not present

## 2019-10-30 DIAGNOSIS — Z0001 Encounter for general adult medical examination with abnormal findings: Secondary | ICD-10-CM | POA: Diagnosis not present

## 2019-10-30 DIAGNOSIS — J449 Chronic obstructive pulmonary disease, unspecified: Secondary | ICD-10-CM | POA: Diagnosis not present

## 2019-11-07 DIAGNOSIS — J449 Chronic obstructive pulmonary disease, unspecified: Secondary | ICD-10-CM | POA: Diagnosis not present

## 2019-12-07 DIAGNOSIS — J449 Chronic obstructive pulmonary disease, unspecified: Secondary | ICD-10-CM | POA: Diagnosis not present

## 2020-01-07 DIAGNOSIS — J449 Chronic obstructive pulmonary disease, unspecified: Secondary | ICD-10-CM | POA: Diagnosis not present

## 2020-02-06 DIAGNOSIS — J449 Chronic obstructive pulmonary disease, unspecified: Secondary | ICD-10-CM | POA: Diagnosis not present

## 2020-03-07 DIAGNOSIS — Z0389 Encounter for observation for other suspected diseases and conditions ruled out: Secondary | ICD-10-CM | POA: Diagnosis not present

## 2020-03-07 DIAGNOSIS — M810 Age-related osteoporosis without current pathological fracture: Secondary | ICD-10-CM | POA: Diagnosis not present

## 2020-03-08 DIAGNOSIS — J449 Chronic obstructive pulmonary disease, unspecified: Secondary | ICD-10-CM | POA: Diagnosis not present

## 2020-03-10 DIAGNOSIS — M545 Low back pain: Secondary | ICD-10-CM | POA: Diagnosis not present

## 2020-10-15 ENCOUNTER — Other Ambulatory Visit: Payer: Self-pay | Admitting: Physician Assistant

## 2021-04-14 ENCOUNTER — Other Ambulatory Visit: Payer: Self-pay | Admitting: Physician Assistant

## 2021-04-17 ENCOUNTER — Other Ambulatory Visit: Payer: Self-pay | Admitting: Physician Assistant

## 2021-04-18 ENCOUNTER — Other Ambulatory Visit: Payer: Self-pay | Admitting: Physician Assistant

## 2022-07-20 DIAGNOSIS — J209 Acute bronchitis, unspecified: Secondary | ICD-10-CM | POA: Diagnosis not present

## 2022-08-08 DIAGNOSIS — J449 Chronic obstructive pulmonary disease, unspecified: Secondary | ICD-10-CM | POA: Diagnosis not present

## 2022-09-08 DIAGNOSIS — J449 Chronic obstructive pulmonary disease, unspecified: Secondary | ICD-10-CM | POA: Diagnosis not present

## 2022-09-24 ENCOUNTER — Other Ambulatory Visit: Payer: Self-pay | Admitting: Family Medicine

## 2022-10-07 DIAGNOSIS — J449 Chronic obstructive pulmonary disease, unspecified: Secondary | ICD-10-CM | POA: Diagnosis not present

## 2022-10-29 DIAGNOSIS — R300 Dysuria: Secondary | ICD-10-CM | POA: Diagnosis not present

## 2022-10-29 DIAGNOSIS — R350 Frequency of micturition: Secondary | ICD-10-CM | POA: Diagnosis not present

## 2022-10-31 DIAGNOSIS — E785 Hyperlipidemia, unspecified: Secondary | ICD-10-CM | POA: Diagnosis not present

## 2022-10-31 DIAGNOSIS — E7849 Other hyperlipidemia: Secondary | ICD-10-CM | POA: Diagnosis not present

## 2022-10-31 DIAGNOSIS — E039 Hypothyroidism, unspecified: Secondary | ICD-10-CM | POA: Diagnosis not present

## 2022-10-31 DIAGNOSIS — I700 Atherosclerosis of aorta: Secondary | ICD-10-CM | POA: Diagnosis not present

## 2022-10-31 DIAGNOSIS — R7303 Prediabetes: Secondary | ICD-10-CM | POA: Diagnosis not present

## 2022-10-31 DIAGNOSIS — J449 Chronic obstructive pulmonary disease, unspecified: Secondary | ICD-10-CM | POA: Diagnosis not present

## 2022-10-31 DIAGNOSIS — I959 Hypotension, unspecified: Secondary | ICD-10-CM | POA: Diagnosis not present

## 2022-11-06 DIAGNOSIS — Z0000 Encounter for general adult medical examination without abnormal findings: Secondary | ICD-10-CM | POA: Diagnosis not present

## 2022-11-06 DIAGNOSIS — E7849 Other hyperlipidemia: Secondary | ICD-10-CM | POA: Diagnosis not present

## 2022-11-06 DIAGNOSIS — M25552 Pain in left hip: Secondary | ICD-10-CM | POA: Diagnosis not present

## 2022-11-06 DIAGNOSIS — R7303 Prediabetes: Secondary | ICD-10-CM | POA: Diagnosis not present

## 2022-11-06 DIAGNOSIS — R030 Elevated blood-pressure reading, without diagnosis of hypertension: Secondary | ICD-10-CM | POA: Diagnosis not present

## 2022-11-06 DIAGNOSIS — J449 Chronic obstructive pulmonary disease, unspecified: Secondary | ICD-10-CM | POA: Diagnosis not present

## 2022-11-07 DIAGNOSIS — J449 Chronic obstructive pulmonary disease, unspecified: Secondary | ICD-10-CM | POA: Diagnosis not present

## 2022-11-12 ENCOUNTER — Other Ambulatory Visit: Payer: Self-pay | Admitting: Family Medicine

## 2022-11-14 DIAGNOSIS — Z1231 Encounter for screening mammogram for malignant neoplasm of breast: Secondary | ICD-10-CM | POA: Diagnosis not present

## 2022-12-07 DIAGNOSIS — J449 Chronic obstructive pulmonary disease, unspecified: Secondary | ICD-10-CM | POA: Diagnosis not present

## 2023-01-01 DIAGNOSIS — J069 Acute upper respiratory infection, unspecified: Secondary | ICD-10-CM | POA: Diagnosis not present

## 2023-01-01 DIAGNOSIS — R300 Dysuria: Secondary | ICD-10-CM | POA: Diagnosis not present

## 2023-01-01 DIAGNOSIS — R030 Elevated blood-pressure reading, without diagnosis of hypertension: Secondary | ICD-10-CM | POA: Diagnosis not present

## 2023-01-01 DIAGNOSIS — J441 Chronic obstructive pulmonary disease with (acute) exacerbation: Secondary | ICD-10-CM | POA: Diagnosis not present

## 2023-01-01 DIAGNOSIS — R509 Fever, unspecified: Secondary | ICD-10-CM | POA: Diagnosis not present

## 2023-01-02 ENCOUNTER — Other Ambulatory Visit: Payer: Self-pay | Admitting: Family Medicine

## 2023-01-03 ENCOUNTER — Other Ambulatory Visit: Payer: Self-pay | Admitting: Family Medicine

## 2023-01-07 DIAGNOSIS — J449 Chronic obstructive pulmonary disease, unspecified: Secondary | ICD-10-CM | POA: Diagnosis not present

## 2023-02-06 DIAGNOSIS — J449 Chronic obstructive pulmonary disease, unspecified: Secondary | ICD-10-CM | POA: Diagnosis not present

## 2023-02-13 DIAGNOSIS — M25552 Pain in left hip: Secondary | ICD-10-CM | POA: Diagnosis not present

## 2023-02-22 DIAGNOSIS — M25552 Pain in left hip: Secondary | ICD-10-CM | POA: Diagnosis not present

## 2023-02-22 DIAGNOSIS — M1612 Unilateral primary osteoarthritis, left hip: Secondary | ICD-10-CM | POA: Diagnosis not present

## 2023-02-27 DIAGNOSIS — Z87891 Personal history of nicotine dependence: Secondary | ICD-10-CM | POA: Diagnosis not present

## 2023-02-27 DIAGNOSIS — R0602 Shortness of breath: Secondary | ICD-10-CM | POA: Diagnosis not present

## 2023-02-27 DIAGNOSIS — M79604 Pain in right leg: Secondary | ICD-10-CM | POA: Diagnosis not present

## 2023-02-27 DIAGNOSIS — R918 Other nonspecific abnormal finding of lung field: Secondary | ICD-10-CM | POA: Diagnosis not present

## 2023-02-27 DIAGNOSIS — J449 Chronic obstructive pulmonary disease, unspecified: Secondary | ICD-10-CM | POA: Diagnosis not present

## 2023-02-27 DIAGNOSIS — Z7951 Long term (current) use of inhaled steroids: Secondary | ICD-10-CM | POA: Diagnosis not present

## 2023-02-27 DIAGNOSIS — R202 Paresthesia of skin: Secondary | ICD-10-CM | POA: Diagnosis not present

## 2023-02-27 DIAGNOSIS — R0600 Dyspnea, unspecified: Secondary | ICD-10-CM | POA: Diagnosis not present

## 2023-02-27 DIAGNOSIS — Z7902 Long term (current) use of antithrombotics/antiplatelets: Secondary | ICD-10-CM | POA: Diagnosis not present

## 2023-02-27 DIAGNOSIS — E785 Hyperlipidemia, unspecified: Secondary | ICD-10-CM | POA: Diagnosis not present

## 2023-02-27 DIAGNOSIS — N281 Cyst of kidney, acquired: Secondary | ICD-10-CM | POA: Diagnosis not present

## 2023-02-27 DIAGNOSIS — Z7982 Long term (current) use of aspirin: Secondary | ICD-10-CM | POA: Diagnosis not present

## 2023-02-27 DIAGNOSIS — K760 Fatty (change of) liver, not elsewhere classified: Secondary | ICD-10-CM | POA: Diagnosis not present

## 2023-02-27 DIAGNOSIS — R911 Solitary pulmonary nodule: Secondary | ICD-10-CM | POA: Diagnosis not present

## 2023-03-01 DIAGNOSIS — M1612 Unilateral primary osteoarthritis, left hip: Secondary | ICD-10-CM | POA: Diagnosis not present

## 2023-03-01 DIAGNOSIS — M25552 Pain in left hip: Secondary | ICD-10-CM | POA: Diagnosis not present

## 2023-03-04 DIAGNOSIS — K529 Noninfective gastroenteritis and colitis, unspecified: Secondary | ICD-10-CM | POA: Diagnosis not present

## 2023-03-04 DIAGNOSIS — R509 Fever, unspecified: Secondary | ICD-10-CM | POA: Diagnosis not present

## 2023-03-04 DIAGNOSIS — R030 Elevated blood-pressure reading, without diagnosis of hypertension: Secondary | ICD-10-CM | POA: Diagnosis not present

## 2023-03-04 DIAGNOSIS — K449 Diaphragmatic hernia without obstruction or gangrene: Secondary | ICD-10-CM | POA: Diagnosis not present

## 2023-03-04 DIAGNOSIS — R1084 Generalized abdominal pain: Secondary | ICD-10-CM | POA: Diagnosis not present

## 2023-03-04 DIAGNOSIS — Z7951 Long term (current) use of inhaled steroids: Secondary | ICD-10-CM | POA: Diagnosis not present

## 2023-03-04 DIAGNOSIS — Z79899 Other long term (current) drug therapy: Secondary | ICD-10-CM | POA: Diagnosis not present

## 2023-03-04 DIAGNOSIS — E785 Hyperlipidemia, unspecified: Secondary | ICD-10-CM | POA: Diagnosis not present

## 2023-03-04 DIAGNOSIS — J449 Chronic obstructive pulmonary disease, unspecified: Secondary | ICD-10-CM | POA: Diagnosis not present

## 2023-03-04 DIAGNOSIS — Z7982 Long term (current) use of aspirin: Secondary | ICD-10-CM | POA: Diagnosis not present

## 2023-03-04 DIAGNOSIS — Z87891 Personal history of nicotine dependence: Secondary | ICD-10-CM | POA: Diagnosis not present

## 2023-03-04 DIAGNOSIS — R110 Nausea: Secondary | ICD-10-CM | POA: Diagnosis not present

## 2023-03-05 ENCOUNTER — Other Ambulatory Visit: Payer: Self-pay | Admitting: Family Medicine

## 2023-03-06 DIAGNOSIS — R030 Elevated blood-pressure reading, without diagnosis of hypertension: Secondary | ICD-10-CM | POA: Diagnosis not present

## 2023-03-06 DIAGNOSIS — R110 Nausea: Secondary | ICD-10-CM | POA: Diagnosis not present

## 2023-03-06 DIAGNOSIS — R1084 Generalized abdominal pain: Secondary | ICD-10-CM | POA: Diagnosis not present

## 2023-03-06 DIAGNOSIS — K529 Noninfective gastroenteritis and colitis, unspecified: Secondary | ICD-10-CM | POA: Diagnosis not present

## 2023-03-09 DIAGNOSIS — J449 Chronic obstructive pulmonary disease, unspecified: Secondary | ICD-10-CM | POA: Diagnosis not present

## 2023-03-10 NOTE — Progress Notes (Unsigned)
GI Office Note    Referring Provider: Juliette Alcide, MD Primary Care Physician:  Juliette Alcide, MD  Primary Gastroenterologist: Roetta Sessions, MD   Chief Complaint   No chief complaint on file.    History of Present Illness   Sherri Reed is a 72 y.o. female presenting today at the request of Lianne Moris, PA-C for colitis.    Recent seen in ED at [facility] on 03/04/23 and diagnosed with colitis. Treated with Cipro/flagyl for 7 days.   Allergies: ace inhibitors, reglan itching/reddness/sob  Brother lung cancer, DVT Mother DVT, SVA, MI Sister MI in her 22s   Labs 02/2023:  WBC 12.3, Hgb 14.3, Platelet 196, Sodium 136, K 3.6, glucose 110, alb 3.7, Tbili 0.4, AP 97, AST 11, ALT 11, lipase 14, magnesium 1.4    CT A/P with IV contrast  03/04/2023: 1. Segment of bowel wall thickening and pericolonic inflammation  involving the ascending colon and proximal transverse colon. No  diverticular disease seen through this region. Findings most  consistent with segmental colitis. Differential include infectious  colitis, inflammatory bowel disease, and ischemic colitis.  2. No perforation or abscess.  3. Normal appendix.  4. Small hiatal hernia.   Colonoscopy 06/2019 by Dr. Marcha Solders: op note not available but Path showed rectal tubular adenoma and he Recommended colonoscopy in five years.   Colonoscopy 02/2008:  IMPRESSION:  1. Minimally friable anal canal hemorrhoids, otherwise, normal rectum.  2. Grossly normal colon, relatively poor prep on the right side made      the exam more  difficult. 3. Recommended 5 year follow colonoscopy given prior history of colon polyps.        Medications   Current Outpatient Medications  Medication Sig Dispense Refill   acetaminophen (TYLENOL) 500 MG tablet Take 500-1,000 mg by mouth as needed for mild pain or moderate pain.      acyclovir (ZOVIRAX) 800 MG tablet Take 1 tablet (800 mg total) by mouth 5 (five) times daily. 35  tablet 0   albuterol (PROAIR HFA) 108 (90 BASE) MCG/ACT inhaler Inhale 1 puff into the lungs every 6 (six) hours as needed for wheezing or shortness of breath.     aspirin 81 MG tablet Take 81 mg by mouth daily.       fluticasone-salmeterol (ADVAIR) 500-50 MCG/ACT AEPB INHALE 1 PUFF TWICE DAILY. 60 each 0   ibuprofen (ADVIL,MOTRIN) 600 MG tablet Take 600 mg by mouth as needed.     nitroGLYCERIN (NITROSTAT) 0.4 MG SL tablet Place 0.4 mg under the tongue every 5 (five) minutes as needed for chest pain.     oxyCODONE-acetaminophen (PERCOCET/ROXICET) 5-325 MG tablet Take 1 tablet by mouth every 4 (four) hours as needed. 20 tablet 0   oxyCODONE-acetaminophen (PERCOCET/ROXICET) 5-325 MG tablet Take 1 tablet by mouth every 4 (four) hours as needed. 20 tablet 0   tiotropium (SPIRIVA) 18 MCG inhalation capsule Place 18 mcg into inhaler and inhale daily.     zolpidem (AMBIEN) 5 MG tablet Take 5 mg by mouth at bedtime as needed for sleep.     No current facility-administered medications for this visit.    Allergies   Allergies as of 03/11/2023 - Review Complete 07/18/2015  Allergen Reaction Noted   Lisinopril Swelling 10/26/2010    Past Medical History   Past Medical History:  Diagnosis Date   COPD (chronic obstructive pulmonary disease) (HCC)    Essential hypertension    Palpitations     Past Surgical  History   Past Surgical History:  Procedure Laterality Date   BREAST BIOPSY     CESAREAN SECTION     LEFT HEART CATHETERIZATION WITH CORONARY ANGIOGRAM N/A 05/07/2014   Procedure: LEFT HEART CATHETERIZATION WITH CORONARY ANGIOGRAM;  Surgeon: Kathleene Hazel, MD;  Location[facility];  Service: Cardiovascular;  Laterality: N/A;   TONSILLECTOMY      Past Family History   Family History  Problem Relation Age of Onset   Coronary artery disease Mother    Emphysema Mother    Breast cancer Maternal Aunt    Brain cancer Maternal Grandmother     Past Social History   Social  History   Socioeconomic History   Marital status: Legally Separated    Spouse name: Not on file   Number of children: 2   Years of education: Not on file   Highest education level: Not on file  Occupational History   Occupation: [employer]  Tobacco Use   Smoking status: Former    Current packs/day: 0.00    Average packs/day: 1.5 packs/day for 30.0 years (45.0 ttl pk-yrs)    Types: Cigarettes    Start date: 07/17/1975    Quit date: 07/16/2005    Years since quitting: 17.6   Smokeless tobacco: Never  Substance and Sexual Activity   Alcohol use: No   Drug use: No   Sexual activity: Not on file  Other Topics Concern   Not on file  Social History Narrative   Not on file   Social Determinants of Health   Financial Resource Strain: Not on file  Food Insecurity: Not on file  Transportation Needs: Not on file  Physical Activity: Not on file  Stress: Not on file  Social Connections: Not on file  Intimate Partner Violence: Not on file    Review of Systems   General: Negative for anorexia, weight loss, fever, chills, fatigue, weakness. Eyes: Negative for vision changes.  ENT: Negative for hoarseness, difficulty swallowing , nasal congestion. CV: Negative for chest pain, angina, palpitations, dyspnea on exertion, peripheral edema.  Respiratory: Negative for dyspnea at rest, dyspnea on exertion, cough, sputum, wheezing.  GI: See history of present illness. GU:  Negative for dysuria, hematuria, urinary incontinence, urinary frequency, nocturnal urination.  MS: Negative for joint pain, low back pain.  Derm: Negative for rash or itching.  Neuro: Negative for weakness, abnormal sensation, seizure, frequent headaches, memory loss,  confusion.  Psych: Negative for anxiety, depression, suicidal ideation, hallucinations.  Endo: Negative for unusual weight change.  Heme: Negative for bruising or bleeding. Allergy: Negative for rash or hives.  Physical Exam   There were no vitals  taken for this visit.   General: Well-nourished, well-developed in no acute distress.  Head: Normocephalic, atraumatic.   Eyes: Conjunctiva pink, no icterus. Mouth: Oropharyngeal mucosa moist and pink , no lesions erythema or exudate. Neck: Supple without thyromegaly, masses, or lymphadenopathy.  Lungs: Clear to auscultation bilaterally.  Heart: Regular rate and rhythm, no murmurs rubs or gallops.  Abdomen: Bowel sounds are normal, nontender, nondistended, no hepatosplenomegaly or masses,  no abdominal bruits or hernia, no rebound or guarding.   Rectal: *** Extremities: No lower extremity edema. No clubbing or deformities.  Neuro: Alert and oriented x 4 , grossly normal neurologically.  Skin: Warm and dry, no rash or jaundice.   Psych: Alert and cooperative, normal mood and affect.  Labs   *** Imaging Studies   No results found.  Assessment  PLAN   ***   Leanna Battles. Melvyn Neth, MHS, PA-C [facility]

## 2023-03-11 ENCOUNTER — Encounter: Payer: Self-pay | Admitting: Gastroenterology

## 2023-03-11 ENCOUNTER — Ambulatory Visit (INDEPENDENT_AMBULATORY_CARE_PROVIDER_SITE_OTHER): Payer: 59 | Admitting: Gastroenterology

## 2023-03-11 VITALS — BP 137/74 | HR 82 | Temp 98.5°F | Ht 63.0 in | Wt 187.4 lb

## 2023-03-11 DIAGNOSIS — R109 Unspecified abdominal pain: Secondary | ICD-10-CM | POA: Diagnosis not present

## 2023-03-11 DIAGNOSIS — R197 Diarrhea, unspecified: Secondary | ICD-10-CM | POA: Insufficient documentation

## 2023-03-11 DIAGNOSIS — R933 Abnormal findings on diagnostic imaging of other parts of digestive tract: Secondary | ICD-10-CM

## 2023-03-11 DIAGNOSIS — A09 Infectious gastroenteritis and colitis, unspecified: Secondary | ICD-10-CM

## 2023-03-11 NOTE — Patient Instructions (Signed)
Complete antibiotics Complete stool tests. We will schedule you for a colonoscopy to evaluate your diarrhea, abnormal colon on CT scan. Limit dairy intake, raw vegetables/salads until your stools improve.

## 2023-03-12 ENCOUNTER — Encounter: Payer: Self-pay | Admitting: *Deleted

## 2023-03-12 ENCOUNTER — Telehealth: Payer: Self-pay | Admitting: *Deleted

## 2023-03-12 ENCOUNTER — Other Ambulatory Visit: Payer: Self-pay | Admitting: *Deleted

## 2023-03-12 MED ORDER — PEG 3350-KCL-NA BICARB-NACL 420 G PO SOLR: 4000.0000 mL | Freq: Once | ORAL | 0 refills | Status: AC

## 2023-03-12 NOTE — Telephone Encounter (Signed)
[employer] PA: Notification or Prior Authorization is not required for the requested services You are not required to submit a notification/prior authorization based on the information provided. If you have general questions about the prior authorization requirements, visit [email or web address] > Clinician Resources > Advance and Admission Notification Requirements. The number above acknowledges your notification. Please write this reference number down for future reference. If you would like to request an organization determination, please call us at [phone]. Decision ID #: [id number]

## 2023-03-13 ENCOUNTER — Encounter: Payer: Self-pay | Admitting: *Deleted

## 2023-03-19 ENCOUNTER — Telehealth: Payer: Self-pay

## 2023-03-19 NOTE — Telephone Encounter (Signed)
Stool culture scanned under media for review.

## 2023-03-28 NOTE — Telephone Encounter (Signed)
[facility] did not do the stool that I ordered. They did a stool culture which was negative.  No need to recollect. Colonoscopy as scheduled.

## 2023-03-29 NOTE — Telephone Encounter (Signed)
Lmom for pt to return call

## 2023-03-29 NOTE — Telephone Encounter (Signed)
Pt was made aware and verbalized understanding.  

## 2023-04-09 DIAGNOSIS — J449 Chronic obstructive pulmonary disease, unspecified: Secondary | ICD-10-CM | POA: Diagnosis not present

## 2023-04-09 NOTE — Patient Instructions (Signed)
Sherri Reed  04/09/2023     @PREFPERIOPPHARMACY @   Your procedure is scheduled on  04/15/2023.   Report to [facility] at  1200  P.M.   Call this number if you have problems the morning of surgery:  [phone]  If you experience any cold or flu symptoms such as cough, fever, chills, shortness of breath, etc. between now and your scheduled surgery, please notify us at the above number.   Remember:  Follow the diet and prep instructions given to you by the office.     Take these medicines the morning of surgery with A SIP OF WATER                                               None.     Do not wear jewelry, make-up or nail polish, including gel polish,  artificial nails, or any other type of covering on natural nails (fingers and  toes).  Do not wear lotions, powders, or perfumes, or deodorant.  Do not shave 48 hours prior to surgery.  Men may shave face and neck.  Do not bring valuables to the hospital.  [facility] is not responsible for any belongings or valuables.  Contacts, dentures or bridgework may not be worn into surgery.  Leave your suitcase in the car.  After surgery it may be brought to your room.  For patients admitted to the hospital, discharge time will be determined by your treatment team.  Patients discharged the day of surgery will not be allowed to drive home and must have someone with them for 24 hours.    Special instructions:   DO NOT smoke tobacco or vape for 24 hours before your procedure.  Please read over the following fact sheets that you were given. Anesthesia Post-op Instructions and Care and Recovery After Surgery      Colonoscopy, Adult, Care After The following information offers guidance on how to care for yourself after your procedure. Your health care provider may also give you more specific instructions. If you have problems or questions, contact your health care provider. What can I expect after the procedure? After the  procedure, it is common to have: A small amount of blood in your stool for 24 hours after the procedure. Some gas. Mild cramping or bloating of your abdomen. Follow these instructions at home: Eating and drinking  Drink enough fluid to keep your urine pale yellow. Follow instructions from your health care provider about eating or drinking restrictions. Resume your normal diet as told by your health care provider. Avoid heavy or fried foods that are hard to digest. Activity Rest as told by your health care provider. Avoid sitting for a long time without moving. Get up to take short walks every 1-2 hours. This is important to improve blood flow and breathing. Ask for help if you feel weak or unsteady. Return to your normal activities as told by your health care provider. Ask your health care provider what activities are safe for you. Managing cramping and bloating  Try walking around when you have cramps or feel bloated. If directed, apply heat to your abdomen as told by your health care provider. Use the heat source that your health care provider recommends, such as a moist heat pack or a heating pad. Place a towel between your skin  and the heat source. Leave the heat on for 20-30 minutes. Remove the heat if your skin turns bright red. This is especially important if you are unable to feel pain, heat, or cold. You have a greater risk of getting burned. General instructions If you were given a sedative during the procedure, it can affect you for several hours. Do not drive or operate machinery until your health care provider says that it is safe. For the first 24 hours after the procedure: Do not sign important documents. Do not drink alcohol. Do your regular daily activities at a slower pace than normal. Eat soft foods that are easy to digest. Take over-the-counter and prescription medicines only as told by your health care provider. Keep all follow-up visits. This is important. Contact  a health care provider if: You have blood in your stool 2-3 days after the procedure. Get help right away if: You have more than a small spotting of blood in your stool. You have large blood clots in your stool. You have swelling of your abdomen. You have nausea or vomiting. You have a fever. You have increasing pain in your abdomen that is not relieved with medicine. These symptoms may be an emergency. Get help right away. Call 911. Do not wait to see if the symptoms will go away. Do not drive yourself to the hospital. Summary After the procedure, it is common to have a small amount of blood in your stool. You may also have mild cramping and bloating of your abdomen. If you were given a sedative during the procedure, it can affect you for several hours. Do not drive or operate machinery until your health care provider says that it is safe. Get help right away if you have a lot of blood in your stool, nausea or vomiting, a fever, or increased pain in your abdomen. This information is not intended to replace advice given to you by your health care provider. Make sure you discuss any questions you have with your health care provider. Document Revised: 08/14/2022 Document Reviewed: 02/22/2021 Elsevier Patient Education  2024 [employer] Monitored Anesthesia Care, Care After The following information offers guidance on how to care for yourself after your procedure. Your health care provider may also give you more specific instructions. If you have problems or questions, contact your health care provider. What can I expect after the procedure? After the procedure, it is common to have: Tiredness. Little or no memory about what happened during or after the procedure. Impaired judgment when it comes to making decisions. Nausea or vomiting. Some trouble with balance. Follow these instructions at home: For the time period you were told by your health care provider:  Rest. Do not participate  in activities where you could fall or become injured. Do not drive or use machinery. Do not drink alcohol. Do not take sleeping pills or medicines that cause drowsiness. Do not make important decisions or sign legal documents. Do not take care of children on your own. Medicines Take over-the-counter and prescription medicines only as told by your health care provider. If you were prescribed antibiotics, take them as told by your health care provider. Do not stop using the antibiotic even if you start to feel better. Eating and drinking Follow instructions from your health care provider about what you may eat and drink. Drink enough fluid to keep your urine pale yellow. If you vomit: Drink clear fluids slowly and in small amounts as you are able. Clear fluids include water,  ice chips, low-calorie sports drinks, and fruit juice that has water added to it (diluted fruit juice). Eat light and bland foods in small amounts as you are able. These foods include bananas, applesauce, rice, lean meats, toast, and crackers. General instructions  Have a responsible adult stay with you for the time you are told. It is important to have someone help care for you until you are awake and alert. If you have sleep apnea, surgery and some medicines can increase your risk for breathing problems. Follow instructions from your health care provider about wearing your sleep device: When you are sleeping. This includes during daytime naps. While taking prescription pain medicines, sleeping medicines, or medicines that make you drowsy. Do not use any products that contain nicotine or tobacco. These products include cigarettes, chewing tobacco, and vaping devices, such as e-cigarettes. If you need help quitting, ask your health care provider. Contact a health care provider if: You feel nauseous or vomit every time you eat or drink. You feel light-headed. You are still sleepy or having trouble with balance after 24  hours. You get a rash. You have a fever. You have redness or swelling around the IV site. Get help right away if: You have trouble breathing. You have new confusion after you get home. These symptoms may be an emergency. Get help right away. Call 911. Do not wait to see if the symptoms will go away. Do not drive yourself to the hospital. This information is not intended to replace advice given to you by your health care provider. Make sure you discuss any questions you have with your health care provider. Document Revised: 11/27/2021 Document Reviewed: 11/27/2021 Elsevier Patient Education  2024 [employer].

## 2023-04-11 ENCOUNTER — Encounter (HOSPITAL_COMMUNITY): Payer: Self-pay

## 2023-04-11 ENCOUNTER — Encounter (HOSPITAL_COMMUNITY)
Admission: RE | Admit: 2023-04-11 | Discharge: 2023-04-11 | Disposition: A | Discharge status: Home / Self Care | Payer: 59 | Source: Ambulatory Visit | Attending: Internal Medicine | Admitting: Internal Medicine

## 2023-04-11 ENCOUNTER — Other Ambulatory Visit: Payer: Self-pay

## 2023-04-11 VITALS — BP 130/55 | HR 72 | Temp 97.8°F | Resp 18 | Ht 63.0 in | Wt 187.4 lb

## 2023-04-11 DIAGNOSIS — Z01810 Encounter for preprocedural cardiovascular examination: Secondary | ICD-10-CM | POA: Insufficient documentation

## 2023-04-11 DIAGNOSIS — I10 Essential (primary) hypertension: Secondary | ICD-10-CM | POA: Insufficient documentation

## 2023-04-11 DIAGNOSIS — Z01818 Encounter for other preprocedural examination: Secondary | ICD-10-CM | POA: Diagnosis present

## 2023-04-11 DIAGNOSIS — R9431 Abnormal electrocardiogram [ECG] [EKG]: Secondary | ICD-10-CM | POA: Insufficient documentation

## 2023-04-15 ENCOUNTER — Encounter (HOSPITAL_COMMUNITY)
Admission: RE | Disposition: A | Discharge status: Home / Self Care | Payer: Self-pay | Source: Ambulatory Visit | Attending: Internal Medicine

## 2023-04-15 ENCOUNTER — Encounter (HOSPITAL_COMMUNITY): Payer: Self-pay

## 2023-04-15 ENCOUNTER — Ambulatory Visit (HOSPITAL_COMMUNITY): Payer: 59 | Admitting: Anesthesiology

## 2023-04-15 ENCOUNTER — Ambulatory Visit (HOSPITAL_COMMUNITY)
Admission: RE | Admit: 2023-04-15 | Discharge: 2023-04-15 | Disposition: A | Discharge status: Home / Self Care | Payer: 59 | Source: Ambulatory Visit | Attending: Internal Medicine | Admitting: Internal Medicine

## 2023-04-15 DIAGNOSIS — Z8719 Personal history of other diseases of the digestive system: Secondary | ICD-10-CM | POA: Insufficient documentation

## 2023-04-15 DIAGNOSIS — Z87891 Personal history of nicotine dependence: Secondary | ICD-10-CM

## 2023-04-15 DIAGNOSIS — K529 Noninfective gastroenteritis and colitis, unspecified: Secondary | ICD-10-CM

## 2023-04-15 DIAGNOSIS — R197 Diarrhea, unspecified: Secondary | ICD-10-CM | POA: Diagnosis not present

## 2023-04-15 DIAGNOSIS — K5730 Diverticulosis of large intestine without perforation or abscess without bleeding: Secondary | ICD-10-CM | POA: Insufficient documentation

## 2023-04-15 DIAGNOSIS — J449 Chronic obstructive pulmonary disease, unspecified: Secondary | ICD-10-CM | POA: Insufficient documentation

## 2023-04-15 DIAGNOSIS — I10 Essential (primary) hypertension: Secondary | ICD-10-CM | POA: Diagnosis not present

## 2023-04-15 DIAGNOSIS — D125 Benign neoplasm of sigmoid colon: Secondary | ICD-10-CM | POA: Diagnosis not present

## 2023-04-15 DIAGNOSIS — K648 Other hemorrhoids: Secondary | ICD-10-CM | POA: Insufficient documentation

## 2023-04-15 DIAGNOSIS — Z09 Encounter for follow-up examination after completed treatment for conditions other than malignant neoplasm: Secondary | ICD-10-CM | POA: Diagnosis not present

## 2023-04-15 DIAGNOSIS — K635 Polyp of colon: Secondary | ICD-10-CM

## 2023-04-15 DIAGNOSIS — K649 Unspecified hemorrhoids: Secondary | ICD-10-CM | POA: Diagnosis not present

## 2023-04-15 HISTORY — PX: POLYPECTOMY: SHX5525

## 2023-04-15 HISTORY — PX: BIOPSY: SHX5522

## 2023-04-15 HISTORY — PX: COLONOSCOPY WITH PROPOFOL: SHX5780

## 2023-04-15 SURGERY — COLONOSCOPY WITH PROPOFOL
Anesthesia: General

## 2023-04-15 MED ORDER — LACTATED RINGERS IV SOLN: INTRAVENOUS | Status: DC

## 2023-04-15 MED ORDER — PROPOFOL 10 MG/ML IV BOLUS
INTRAVENOUS | Status: DC | PRN
Start: 2023-04-15 — End: 2023-04-15
  Administered 2023-04-15: 80 mg via INTRAVENOUS

## 2023-04-15 MED ORDER — PROPOFOL 500 MG/50ML IV EMUL
INTRAVENOUS | Status: DC | PRN
  Administered 2023-04-15: 150 ug/kg/min via INTRAVENOUS

## 2023-04-15 NOTE — Anesthesia Preprocedure Evaluation (Addendum)
Anesthesia Evaluation  Patient identified by MRN, date of birth, ID band Patient awake    Reviewed: Allergy & Precautions, H&P , NPO status , Patient's Chart, lab work & pertinent test results, reviewed documented beta blocker date and time   Airway Mallampati: II  TM Distance: >3 FB Neck ROM: full    Dental  (+) Edentulous Upper, Edentulous Lower   Pulmonary COPD, former smoker   Pulmonary exam normal breath sounds clear to auscultation       Cardiovascular Exercise Tolerance: Good hypertension, Normal cardiovascular exam(-) dysrhythmias  Rhythm:regular Rate:Normal  palpitations   Neuro/Psych negative neurological ROS  negative psych ROS   GI/Hepatic negative GI ROS, Neg liver ROS,,,  Endo/Other  negative endocrine ROS    Renal/GU negative Renal ROS  negative genitourinary   Musculoskeletal   Abdominal   Peds  Hematology negative hematology ROS (+)   Anesthesia Other Findings   Reproductive/Obstetrics negative OB ROS                             Anesthesia Physical Anesthesia Plan  ASA: 3  Anesthesia Plan: General   Post-op Pain Management:    Induction: Intravenous  PONV Risk Score and Plan: 1 and Propofol infusion  Airway Management Planned: Nasal Cannula  Additional Equipment:   Intra-op Plan:   Post-operative Plan:   Informed Consent: I have reviewed the patients History and Physical, chart, labs and discussed the procedure including the risks, benefits and alternatives for the proposed anesthesia with the patient or authorized representative who has indicated his/her understanding and acceptance.     Dental Advisory Given  Plan Discussed with: CRNA  Anesthesia Plan Comments:         Anesthesia Quick Evaluation

## 2023-04-15 NOTE — Op Note (Signed)
[facility] Patient Name: Sherri Reed Procedure Date: 04/15/2023 12:19 PM MRN: [id number] Date of Birth: [birth year] Attending MD: Hennie Duos. Marletta Lor , [place], [id number] CSN: [id number] Age: 72 Admit Type: Outpatient Procedure:                Colonoscopy Indications:              Follow-up of colitis Providers:                Hennie Duos. Marletta Lor, DO, Buel Ream. Thomasena Edis RN, RN,                            Dyann Ruddle Referring MD:              Medicines:                See the Anesthesia note for documentation of the                            administered medications Complications:            No immediate complications. Estimated Blood Loss:     Estimated blood loss was minimal. Procedure:                Pre-Anesthesia Assessment:                           - The anesthesia plan was to use monitored                            anesthesia care (MAC).                           After obtaining informed consent, the colonoscope                            was passed under direct vision. Throughout the                            procedure, the patient's blood pressure, pulse, and                            oxygen saturations were monitored continuously. The                            PCF-HQ190L ([id number]) scope was introduced through                            the anus and advanced to the the terminal ileum,                            with identification of the appendiceal orifice and                            IC valve. The colonoscopy was performed without                            difficulty. The patient tolerated the  procedure                            well. The quality of the bowel preparation was                            evaluated using the BBPS [facility] Bowel Preparation                            Scale) with scores of: Right Colon = 2 (minor                            amount of residual staining, small fragments of                            stool and/or opaque liquid, but mucosa  seen well),                            Transverse Colon = 2 (minor amount of residual                            staining, small fragments of stool and/or opaque                            liquid, but mucosa seen well) and Left Colon = 2                            (minor amount of residual staining, small fragments                            of stool and/or opaque liquid, but mucosa seen                            well). The total BBPS score equals 6. Fair. Scope In: 12:57:53 PM Scope Out: 1:17:12 PM Scope Withdrawal Time: 0 hours 14 minutes 56 seconds  Total Procedure Duration: 0 hours 19 minutes 19 seconds  Findings:      Non-bleeding internal hemorrhoids were found during endoscopy.      Multiple medium-mouthed and small-mouthed diverticula were found in the       sigmoid colon and descending colon.      Two sessile polyps were found in the ascending colon and cecum. The       polyps were 4 to 5 mm in size. These polyps were removed with a cold       snare. Resection and retrieval were complete.      A 4 mm polyp was found in the sigmoid colon. The polyp was sessile. The       polyp was removed with a cold snare. Resection and retrieval were       complete.      There is no endoscopic evidence of inflammation in the entire colon.      The terminal ileum appeared normal.      Biopsies were taken with a cold forceps in the ascending colon for       histology. Impression:               -  Non-bleeding internal hemorrhoids.                           - Diverticulosis in the sigmoid colon and in the                            descending colon.                           - Two 4 to 5 mm polyps in the ascending colon and                            in the cecum, removed with a cold snare. Resected                            and retrieved.                           - One 4 mm polyp in the sigmoid colon, removed with                            a cold snare. Resected and retrieved.                            - The examined portion of the ileum was normal.                           - Biopsies were taken with a cold forceps for                            histology in the ascending colon. Moderate Sedation:      Per Anesthesia Care Recommendation:           - Patient has a contact number available for                            emergencies. The signs and symptoms of potential                            delayed complications were discussed with the                            patient. Return to normal activities tomorrow.                            Written discharge instructions were provided to the                            patient.                           - Resume previous diet.                           - Continue present medications.                           -  Await pathology results.                           - Repeat colonoscopy in 5 years for surveillance.                           - Return to GI clinic PRN. Procedure Code(s):        --- Professional ---                           [phone], Colonoscopy, flexible; with removal of                            tumor(s), polyp(s), or other lesion(s) by snare                            technique                           45380, 59, Colonoscopy, flexible; with biopsy,                            single or multiple Diagnosis Code(s):        --- Professional ---                           K64.8, Other hemorrhoids                           D12.2, Benign neoplasm of ascending colon                           D12.0, Benign neoplasm of cecum                           D12.5, Benign neoplasm of sigmoid colon                           K52.9, Noninfective gastroenteritis and colitis,                            unspecified                           K57.30, Diverticulosis of large intestine without                            perforation or abscess without bleeding CPT copyright 2022 American Medical Association. All rights reserved. The codes  documented in this report are preliminary and upon coder review may  be revised to meet current compliance requirements. Hennie Duos. Marletta Lor, DO Hennie Duos. Janijah Symons, DO 04/15/2023 1:20:30 PM This report has been signed electronically. Number of Addenda: 0

## 2023-04-15 NOTE — Anesthesia Postprocedure Evaluation (Signed)
Anesthesia Post Note  Patient: Sherri Reed  Procedure(s) Performed: COLONOSCOPY WITH PROPOFOL POLYPECTOMY BIOPSY  Patient location during evaluation: PACU Anesthesia Type: General Level of consciousness: awake and alert Pain management: pain level controlled Vital Signs Assessment: post-procedure vital signs reviewed and stable Respiratory status: spontaneous breathing, nonlabored ventilation, respiratory function stable and patient connected to nasal cannula oxygen Cardiovascular status: blood pressure returned to baseline and stable Postop Assessment: no apparent nausea or vomiting Anesthetic complications: no   There were no known notable events for this encounter.   Last Vitals:  Vitals:   04/15/23 1149 04/15/23 1321  BP: (!) 162/84 130/66  Pulse: 94 78  Resp:  16  Temp: 36.6 C 36.4 C  SpO2: 95% 98%    Last Pain:  Vitals:   04/15/23 1321  TempSrc:   PainSc: 0-No pain                 [name] L Gwynevere Lizana

## 2023-04-15 NOTE — H&P (Signed)
Primary Care Physician:  Juliette Alcide, MD Primary Gastroenterologist:  Dr. Marletta Lor  Pre-Procedure History & Physical: HPI:  Sherri Reed is a 72 y.o. female is here for a colonoscopy to be performed for colitis and diarrhea  Past Medical History:  Diagnosis Date   COPD (chronic obstructive pulmonary disease) (HCC)    Essential hypertension    Hyperlipidemia    Palpitations     Past Surgical History:  Procedure Laterality Date   BREAST BIOPSY     CESAREAN SECTION     LEFT HEART CATHETERIZATION WITH CORONARY ANGIOGRAM N/A 05/07/2014   Procedure: LEFT HEART CATHETERIZATION WITH CORONARY ANGIOGRAM;  Surgeon: Kathleene Hazel, MD;  Location: [facility] CATH LAB;  Service: Cardiovascular;  Laterality: N/A;   TONSILLECTOMY      Prior to Admission medications   Medication Sig Start Date End Date Taking? Authorizing Provider  aspirin 81 MG tablet Take 81 mg by mouth daily.     Yes [provider]  atorvastatin (LIPITOR) 20 MG tablet Take 20 mg by mouth daily.   Yes [provider]  fluticasone-salmeterol (ADVAIR) 500-50 MCG/ACT AEPB INHALE 1 PUFF TWICE DAILY. 01/03/23  Yes Garnette Gunner, MD  tiotropium (SPIRIVA) 18 MCG inhalation capsule Place 18 mcg into inhaler and inhale daily.   Yes [provider]  acetaminophen (TYLENOL) 500 MG tablet Take 500-1,000 mg by mouth as needed for mild pain or moderate pain.     [provider]  albuterol (PROAIR HFA) 108 (90 BASE) MCG/ACT inhaler Inhale 1 puff into the lungs every 6 (six) hours as needed for wheezing or shortness of breath.    [provider]  ibuprofen (ADVIL,MOTRIN) 600 MG tablet Take 600 mg by mouth as needed. 02/09/13   Bonk, Orson Aloe, MD  Melatonin 10 MG SUBL Place 20 mg under the tongue at bedtime.    [provider]    Allergies as of 03/12/2023 - Review Complete 03/11/2023  Allergen Reaction Noted   Reglan [metoclopramide] Shortness Of Breath and Itching 03/11/2023    Lisinopril Swelling 10/26/2010   Nabumetone Itching 05/06/2019    Family History  Problem Relation Age of Onset   Coronary artery disease Mother    Emphysema Mother    Deep vein thrombosis Mother    Heart attack Sister    Lung cancer Brother    Deep vein thrombosis Brother    Heart disease Brother    Brain cancer Maternal Grandmother    Breast cancer Maternal Aunt     Social History   Socioeconomic History   Marital status: Legally Separated    Spouse name: Not on file   Number of children: 2   Years of education: Not on file   Highest education level: Not on file  Occupational History   Occupation: [employer]  Tobacco Use   Smoking status: Former    Current packs/day: 0.00    Average packs/day: 1.5 packs/day for 30.0 years (45.0 ttl pk-yrs)    Types: Cigarettes    Start date: 07/17/1975    Quit date: 07/16/2005    Years since quitting: 17.7   Smokeless tobacco: Never  Vaping Use   Vaping status: Never Used  Substance and Sexual Activity   Alcohol use: No   Drug use: No   Sexual activity: Not Currently  Other Topics Concern   Not on file  Social History Narrative   Not on file   Social Determinants of Health   Financial Resource Strain: Not on file  Food Insecurity: Not on file  Transportation Needs: Not on file  Physical Activity: Not on file  Stress: Not on file  Social Connections: Not on file  Intimate Partner Violence: Not on file    Review of Systems: See HPI, otherwise negative ROS  Physical Exam: Vital signs in last 24 hours: Temp:  [97.9 F (36.6 C)] 97.9 F (36.6 C) (09/30 1149) Pulse Rate:  [94] 94 (09/30 1149) BP: (162)/(84) 162/84 (09/30 1149) SpO2:  [95 %] 95 % (09/30 1149) Weight:  [85 kg] 85 kg (09/30 1149)   General:   Alert,  Well-developed, well-nourished, pleasant and cooperative in NAD Head:  Normocephalic and atraumatic. Eyes:  Sclera clear, no icterus.   Conjunctiva pink. Ears:  Normal auditory acuity. Nose:  No  deformity, discharge,  or lesions. Msk:  Symmetrical without gross deformities. Normal posture. Extremities:  Without clubbing or edema. Neurologic:  Alert and  oriented x4;  grossly normal neurologically. Skin:  Intact without significant lesions or rashes. Psych:  Alert and cooperative. Normal mood and affect.  Impression/Plan: Sherri Reed is here for a colonoscopy to be performed for colitis and diarrhea  The risks of the procedure including infection, bleed, or perforation as well as benefits, limitations, alternatives and imponderables have been reviewed with the patient. Questions have been answered. All parties agreeable.

## 2023-04-15 NOTE — Transfer of Care (Signed)
Immediate Anesthesia Transfer of Care Note  Patient: Sherri Reed  Procedure(s) Performed: COLONOSCOPY WITH PROPOFOL POLYPECTOMY BIOPSY  Patient Location: Short Stay  Anesthesia Type:General  Level of Consciousness: awake  Airway & Oxygen Therapy: Patient Spontanous Breathing  Post-op Assessment: Report given to RN and Post -op Vital signs reviewed and stable  Post vital signs: Reviewed and stable  Last Vitals:  Vitals Value Taken Time  BP 130/66 04/15/23 1321  Temp 36.4 C 04/15/23 1321  Pulse 78 04/15/23 1321  Resp 16 04/15/23 1321  SpO2 98 % 04/15/23 1321    Last Pain:  Vitals:   04/15/23 1321  TempSrc:   PainSc: 0-No pain         Complications: No notable events documented.

## 2023-04-15 NOTE — Discharge Instructions (Signed)
  Colonoscopy Discharge Instructions  Read the instructions outlined below and refer to this sheet in the next few weeks. These discharge instructions provide you with general information on caring for yourself after you leave the hospital. Your doctor may also give you specific instructions. While your treatment has been planned according to the most current medical practices available, unavoidable complications occasionally occur.   ACTIVITY You may resume your regular activity, but move at a slower pace for the next 24 hours.  Take frequent rest periods for the next 24 hours.  Walking will help get rid of the air and reduce the bloated feeling in your belly (abdomen).  No driving for 24 hours (because of the medicine (anesthesia) used during the test).   Do not sign any important legal documents or operate any machinery for 24 hours (because of the anesthesia used during the test).  NUTRITION Drink plenty of fluids.  You may resume your normal diet as instructed by your doctor.  Begin with a light meal and progress to your normal diet. Heavy or fried foods are harder to digest and may make you feel sick to your stomach (nauseated).  Avoid alcoholic beverages for 24 hours or as instructed.  MEDICATIONS You may resume your normal medications unless your doctor tells you otherwise.  WHAT YOU CAN EXPECT TODAY Some feelings of bloating in the abdomen.  Passage of more gas than usual.  Spotting of blood in your stool or on the toilet paper.  IF YOU HAD POLYPS REMOVED DURING THE COLONOSCOPY: No aspirin products for 7 days or as instructed.  No alcohol for 7 days or as instructed.  Eat a soft diet for the next 24 hours.  FINDING OUT THE RESULTS OF YOUR TEST Not all test results are available during your visit. If your test results are not back during the visit, make an appointment with your caregiver to find out the results. Do not assume everything is normal if you have not heard from your  caregiver or the medical facility. It is important for you to follow up on all of your test results.  SEEK IMMEDIATE MEDICAL ATTENTION IF: You have more than a spotting of blood in your stool.  Your belly is swollen (abdominal distention).  You are nauseated or vomiting.  You have a temperature over 101.  You have abdominal pain or discomfort that is severe or gets worse throughout the day.   Your colonoscopy revealed 3 polyp(s) which I removed successfully. I recommend repeating colonoscopy in 5 years for surveillance purposes.   Overall, your colon appeared very healthy.  I did not see any active inflammation indicative of underlying inflammatory bowel disease such as Crohn's disease or ulcerative colitis throughout your colon or end portion of your small bowel.  I took biopsies of your colon to further evaluate.    Await pathology results, my office will contact you.  You also have diverticulosis and internal hemorrhoids. I would recommend increasing fiber in your diet or adding OTC Benefiber/Metamucil. Be sure to drink at least 4 to 6 glasses of water daily. Follow-up with GI as needed.   I hope you have a great rest of your week!  Hennie Duos. Marletta Lor, D.O. Gastroenterology and Hepatology [facility]

## 2023-04-16 DIAGNOSIS — R300 Dysuria: Secondary | ICD-10-CM | POA: Diagnosis not present

## 2023-04-16 LAB — SURGICAL PATHOLOGY

## 2023-04-19 DIAGNOSIS — N390 Urinary tract infection, site not specified: Secondary | ICD-10-CM | POA: Diagnosis not present

## 2023-04-22 ENCOUNTER — Encounter (HOSPITAL_COMMUNITY): Payer: Self-pay | Admitting: Internal Medicine

## 2023-04-22 ENCOUNTER — Telehealth: Payer: Self-pay

## 2023-04-22 NOTE — Telephone Encounter (Signed)
Pt called wanting the results of her colonoscopy. Please  advise

## 2023-04-26 NOTE — Telephone Encounter (Signed)
Polyps removed mix of benign hyperplastic and tubular adenomas, repeat colonoscopy in 5 years. FU as needed, thank you

## 2023-04-29 NOTE — Telephone Encounter (Signed)
Darl Pikes please NIC for 5 year repeat colonoscopy  Phoned and advised the pt of her results/instructions to next colonoscopy. Pt expressed understanding

## 2023-04-29 NOTE — Telephone Encounter (Signed)
On recall

## 2023-05-02 DIAGNOSIS — J449 Chronic obstructive pulmonary disease, unspecified: Secondary | ICD-10-CM | POA: Diagnosis not present

## 2023-05-02 DIAGNOSIS — R0902 Hypoxemia: Secondary | ICD-10-CM | POA: Diagnosis not present

## 2023-05-02 DIAGNOSIS — E039 Hypothyroidism, unspecified: Secondary | ICD-10-CM | POA: Diagnosis not present

## 2023-05-02 DIAGNOSIS — R5383 Other fatigue: Secondary | ICD-10-CM | POA: Diagnosis not present

## 2023-05-02 DIAGNOSIS — R7303 Prediabetes: Secondary | ICD-10-CM | POA: Diagnosis not present

## 2023-05-02 DIAGNOSIS — E785 Hyperlipidemia, unspecified: Secondary | ICD-10-CM | POA: Diagnosis not present

## 2023-05-08 DIAGNOSIS — M25552 Pain in left hip: Secondary | ICD-10-CM | POA: Diagnosis not present

## 2023-05-08 DIAGNOSIS — R030 Elevated blood-pressure reading, without diagnosis of hypertension: Secondary | ICD-10-CM | POA: Diagnosis not present

## 2023-05-08 DIAGNOSIS — N8110 Cystocele, unspecified: Secondary | ICD-10-CM | POA: Diagnosis not present

## 2023-05-08 DIAGNOSIS — E7849 Other hyperlipidemia: Secondary | ICD-10-CM | POA: Diagnosis not present

## 2023-05-08 DIAGNOSIS — J449 Chronic obstructive pulmonary disease, unspecified: Secondary | ICD-10-CM | POA: Diagnosis not present

## 2023-05-08 DIAGNOSIS — Z23 Encounter for immunization: Secondary | ICD-10-CM | POA: Diagnosis not present

## 2023-05-08 DIAGNOSIS — R300 Dysuria: Secondary | ICD-10-CM | POA: Diagnosis not present

## 2023-05-08 DIAGNOSIS — R7303 Prediabetes: Secondary | ICD-10-CM | POA: Diagnosis not present

## 2023-05-09 DIAGNOSIS — J449 Chronic obstructive pulmonary disease, unspecified: Secondary | ICD-10-CM | POA: Diagnosis not present

## 2023-05-20 ENCOUNTER — Ambulatory Visit: Payer: 59 | Admitting: Urology

## 2023-06-09 DIAGNOSIS — J449 Chronic obstructive pulmonary disease, unspecified: Secondary | ICD-10-CM | POA: Diagnosis not present

## 2023-06-17 NOTE — Progress Notes (Incomplete)
Chief Complaint: No chief complaint on file.   History of Present Illness:  Sherri Reed is a 72 y.o. female who is seen in consultation from Juliette Alcide, MD for evaluation of recent episode of pelvic pain.  Apparently, this started in August or September of this year.  Associated with significant pelvic pressure, nonlateralizing.  Not made worse or better with urinating or defecating.  Not associated with dysuria.  Apparently, she had a CT abdomen and pelvis performed (results not available to me) which revealed no urologic abnormality.  She saw Lianne Moris, PA for an examination which was quite uncomfortable.  However, after that exam, apparently in October, she had resolution of her symptoms.  She has not been treated for frequent urinary tract infections.  She has not had blood in the urine.  She has not had any kidney stones in the past.  No prior urologic consultation.  She has had a colonoscopy after that prior CT revealed an abnormality of her colon.  She was told that that was normal.   Past Medical History:  Past Medical History:  Diagnosis Date   COPD (chronic obstructive pulmonary disease) (HCC)    Essential hypertension    Hyperlipidemia    Palpitations     Past Surgical History:  Past Surgical History:  Procedure Laterality Date   BIOPSY  04/15/2023   Procedure: BIOPSY;  Surgeon: Lanelle Bal, DO;  [facility];  Service: Endoscopy;;   BREAST BIOPSY     CESAREAN SECTION     COLONOSCOPY WITH PROPOFOL N/A 04/15/2023   Procedure: COLONOSCOPY WITH PROPOFOL;  Surgeon: Lanelle Bal, DO;  Location: [facility];  Service: Endoscopy;  Laterality: N/A;  1:45 pm, asa 3   LEFT HEART CATHETERIZATION WITH CORONARY ANGIOGRAM N/A 05/07/2014   Procedure: LEFT HEART CATHETERIZATION WITH CORONARY ANGIOGRAM;  Surgeon: Kathleene Hazel, MD;  Location: [facility] CATH LAB;  Service: Cardiovascular;  Laterality: N/A;   POLYPECTOMY  04/15/2023   Procedure:  POLYPECTOMY;  Surgeon: Lanelle Bal, DO;  [facility];  Service: Endoscopy;;   TONSILLECTOMY      Allergies:  Allergies  Allergen Reactions   Reglan [Metoclopramide] Shortness Of Breath and Itching   Lisinopril Swelling   Nabumetone Itching    Sore throat, felt like throat was swelling.    Family History:  Family History  Problem Relation Age of Onset   Coronary artery disease Mother    Emphysema Mother    Deep vein thrombosis Mother    Heart attack Sister    Lung cancer Brother    Deep vein thrombosis Brother    Heart disease Brother    Brain cancer Maternal Grandmother    Breast cancer Maternal Aunt     Social History:  Social History   Tobacco Use   Smoking status: Former    Current packs/day: 0.00    Average packs/day: 1.5 packs/day for 30.0 years (45.0 ttl pk-yrs)    Types: Cigarettes    Start date: 07/17/1975    Quit date: 07/16/2005    Years since quitting: 17.9   Smokeless tobacco: Never  Vaping Use   Vaping status: Never Used  Substance Use Topics   Alcohol use: No   Drug use: No    Review of symptoms:  Constitutional:  Negative for unexplained weight loss, night sweats, fever, chills ENT:  Negative for nose bleeds, sinus pain, painful swallowing CV:  Negative for chest pain, shortness of breath, exercise intolerance, palpitations,  loss of consciousness Resp:  Negative for cough, wheezing, shortness of breath GI:  Negative for nausea, vomiting, diarrhea, bloody stools GU:  Positives noted in HPI; otherwise negative for gross hematuria, dysuria, urinary incontinence Neuro:  Negative for seizures, poor balance, limb weakness, slurred speech Psych:  Negative for lack of energy, depression, anxiety Endocrine:  Negative for polydipsia, polyuria, symptoms of hypoglycemia (dizziness, hunger, sweating) Hematologic:  Negative for anemia, purpura, petechia, prolonged or excessive bleeding, use of anticoagulants  Allergic:  Negative for difficulty  breathing or choking as a result of exposure to anything; no shellfish allergy; no allergic response (rash/itch) to materials, foods  Physical exam: There were no vitals taken for this visit. GENERAL APPEARANCE:  Well appearing, well developed, well nourished, NAD HEENT: Atraumatic, Normocephalic, oropharynx clear. NECK: Supple without lymphadenopathy or thyromegaly. LUNGS: Clear to auscultation bilaterally. HEART: Regular Rate and Rhythm without murmurs, gallops, or rubs. ABDOMEN: Soft, non-tender, No Masses. EXTREMITIES: Moves all extremities well.  Without clubbing, cyanosis, or edema. NEUROLOGIC:  Alert and oriented x 3, normal gait, CN II-XII grossly intact.  MENTAL STATUS:  Appropriate. BACK:  Non-tender to palpation.  No CVAT SKIN:  Warm, dry and intact.    Results: No results found for this or any previous visit (from the past 24 hour(s)).  I have reviewed prior patient's records  I have reviewed referring/prior physicians records  I have reviewed urinalysis--clear today  I have reviewed CT images from August, 2024- 1. Segment of bowel wall thickening and pericolonic inflammation involving the ascending colon and proximal transverse colon. No diverticular disease seen through this region. Findings most consistent with segmental colitis. Differential include infectious colitis, inflammatory bowel disease, and ischemic colitis. 2. No perforation or abscess. 3. Normal appendix. 4. Small hiatal hernia.   Assessment: -History of pelvic pain without abnormalities on urinalysis.  This has resolved.  CT abdomen and pelvis was negative for urologic issues.  She is asymptomatic now with clear urine  -Apparent history of UTI, not documented today   Plan: -I reassured the patient about her urine as well as CT images  -I will have her come back as needed

## 2023-06-17 NOTE — Progress Notes (Incomplete)
Chief Complaint: No chief complaint on file.   History of Present Illness:  Sherri Reed is a 72 y.o. female who is seen in consultation from Burdine, Ananias Pilgrim, MD for evaluation of ***.   Past Medical History:  Past Medical History:  Diagnosis Date   COPD (chronic obstructive pulmonary disease) (HCC)    Essential hypertension    Hyperlipidemia    Palpitations     Past Surgical History:  Past Surgical History:  Procedure Laterality Date   BIOPSY  04/15/2023   Procedure: BIOPSY;  Surgeon: Lanelle Bal, DO;  [facility];  Service: Endoscopy;;   BREAST BIOPSY     CESAREAN SECTION     COLONOSCOPY WITH PROPOFOL N/A 04/15/2023   Procedure: COLONOSCOPY WITH PROPOFOL;  Surgeon: Lanelle Bal, DO;  [facility];  Service: Endoscopy;  Laterality: N/A;  1:45 pm, asa 3   LEFT HEART CATHETERIZATION WITH CORONARY ANGIOGRAM N/A 05/07/2014   Procedure: LEFT HEART CATHETERIZATION WITH CORONARY ANGIOGRAM;  Surgeon: Kathleene Hazel, MD;  Location: [facility] CATH LAB;  Service: Cardiovascular;  Laterality: N/A;   POLYPECTOMY  04/15/2023   Procedure: POLYPECTOMY;  Surgeon: Lanelle Bal, DO;  [facility];  Service: Endoscopy;;   TONSILLECTOMY      Allergies:  Allergies  Allergen Reactions   Reglan [Metoclopramide] Shortness Of Breath and Itching   Lisinopril Swelling   Nabumetone Itching    Sore throat, felt like throat was swelling.    Family History:  Family History  Problem Relation Age of Onset   Coronary artery disease Mother    Emphysema Mother    Deep vein thrombosis Mother    Heart attack Sister    Lung cancer Brother    Deep vein thrombosis Brother    Heart disease Brother    Brain cancer Maternal Grandmother    Breast cancer Maternal Aunt     Social History:  Social History   Tobacco Use   Smoking status: Former    Current packs/day: 0.00    Average packs/day: 1.5 packs/day for 30.0 years (45.0 ttl pk-yrs)     Types: Cigarettes    Start date: 07/17/1975    Quit date: 07/16/2005    Years since quitting: 17.9   Smokeless tobacco: Never  Vaping Use   Vaping status: Never Used  Substance Use Topics   Alcohol use: No   Drug use: No    Review of symptoms:  Constitutional:  Negative for unexplained weight loss, night sweats, fever, chills ENT:  Negative for nose bleeds, sinus pain, painful swallowing CV:  Negative for chest pain, shortness of breath, exercise intolerance, palpitations, loss of consciousness Resp:  Negative for cough, wheezing, shortness of breath GI:  Negative for nausea, vomiting, diarrhea, bloody stools GU:  Positives noted in HPI; otherwise negative for gross hematuria, dysuria, urinary incontinence Neuro:  Negative for seizures, poor balance, limb weakness, slurred speech Psych:  Negative for lack of energy, depression, anxiety Endocrine:  Negative for polydipsia, polyuria, symptoms of hypoglycemia (dizziness, hunger, sweating) Hematologic:  Negative for anemia, purpura, petechia, prolonged or excessive bleeding, use of anticoagulants  Allergic:  Negative for difficulty breathing or choking as a result of exposure to anything; no shellfish allergy; no allergic response (rash/itch) to materials, foods  Physical exam: There were no vitals taken for this visit. GENERAL APPEARANCE:  Well appearing, well developed, well nourished, NAD HEENT: Atraumatic, Normocephalic, oropharynx clear. NECK: Supple without lymphadenopathy or thyromegaly. LUNGS: Clear to auscultation bilaterally.  HEART: Regular Rate and Rhythm without murmurs, gallops, or rubs. ABDOMEN: Soft, non-tender, No Masses. EXTREMITIES: Moves all extremities well.  Without clubbing, cyanosis, or edema. NEUROLOGIC:  Alert and oriented x 3, normal gait, CN II-XII grossly intact.  MENTAL STATUS:  Appropriate. BACK:  Non-tender to palpation.  No CVAT SKIN:  Warm, dry and intact.    Results: No results found for this or any  previous visit (from the past 24 hour(s)).  I have reviewed prior patient's records  I have reviewed referring/prior physicians records  I have reviewed urinalysis  I have reviewed prior urine cultures  I reviewed prior imaging studies  Assessment: No diagnosis found.   Plan: ***

## 2023-06-18 ENCOUNTER — Ambulatory Visit (INDEPENDENT_AMBULATORY_CARE_PROVIDER_SITE_OTHER): Payer: 59 | Admitting: Urology

## 2023-06-18 VITALS — BP 130/85

## 2023-06-18 DIAGNOSIS — Z87440 Personal history of urinary (tract) infections: Secondary | ICD-10-CM

## 2023-06-18 DIAGNOSIS — N3000 Acute cystitis without hematuria: Secondary | ICD-10-CM | POA: Diagnosis not present

## 2023-06-18 DIAGNOSIS — Z87898 Personal history of other specified conditions: Secondary | ICD-10-CM | POA: Diagnosis not present

## 2023-06-18 DIAGNOSIS — Z09 Encounter for follow-up examination after completed treatment for conditions other than malignant neoplasm: Secondary | ICD-10-CM | POA: Diagnosis not present

## 2023-06-18 DIAGNOSIS — N281 Cyst of kidney, acquired: Secondary | ICD-10-CM

## 2023-06-18 LAB — URINALYSIS, ROUTINE W REFLEX MICROSCOPIC
Bilirubin, UA: NEGATIVE
Glucose, UA: NEGATIVE
Ketones, UA: NEGATIVE
Leukocytes,UA: NEGATIVE
Nitrite, UA: NEGATIVE
Protein,UA: NEGATIVE
RBC, UA: NEGATIVE
Specific Gravity, UA: 1.01 (ref 1.005–1.030)
Urobilinogen, Ur: 0.2 mg/dL (ref 0.2–1.0)
pH, UA: 7.5 (ref 5.0–7.5)

## 2023-06-22 DIAGNOSIS — S0990XA Unspecified injury of head, initial encounter: Secondary | ICD-10-CM | POA: Diagnosis not present

## 2023-06-22 DIAGNOSIS — E785 Hyperlipidemia, unspecified: Secondary | ICD-10-CM | POA: Diagnosis not present

## 2023-06-22 DIAGNOSIS — Z79899 Other long term (current) drug therapy: Secondary | ICD-10-CM | POA: Diagnosis not present

## 2023-06-22 DIAGNOSIS — S0083XA Contusion of other part of head, initial encounter: Secondary | ICD-10-CM | POA: Diagnosis not present

## 2023-06-22 DIAGNOSIS — J449 Chronic obstructive pulmonary disease, unspecified: Secondary | ICD-10-CM | POA: Diagnosis not present

## 2023-06-22 DIAGNOSIS — H5710 Ocular pain, unspecified eye: Secondary | ICD-10-CM | POA: Diagnosis not present

## 2023-06-22 DIAGNOSIS — R220 Localized swelling, mass and lump, head: Secondary | ICD-10-CM | POA: Diagnosis not present

## 2023-06-22 DIAGNOSIS — Z87891 Personal history of nicotine dependence: Secondary | ICD-10-CM | POA: Diagnosis not present

## 2023-07-09 DIAGNOSIS — J449 Chronic obstructive pulmonary disease, unspecified: Secondary | ICD-10-CM | POA: Diagnosis not present

## 2023-08-01 DIAGNOSIS — J441 Chronic obstructive pulmonary disease with (acute) exacerbation: Secondary | ICD-10-CM | POA: Diagnosis not present

## 2023-08-01 DIAGNOSIS — R509 Fever, unspecified: Secondary | ICD-10-CM | POA: Diagnosis not present

## 2023-08-09 DIAGNOSIS — J449 Chronic obstructive pulmonary disease, unspecified: Secondary | ICD-10-CM | POA: Diagnosis not present

## 2023-11-18 DIAGNOSIS — Z1231 Encounter for screening mammogram for malignant neoplasm of breast: Secondary | ICD-10-CM | POA: Diagnosis not present

## 2023-12-07 DIAGNOSIS — J449 Chronic obstructive pulmonary disease, unspecified: Secondary | ICD-10-CM | POA: Diagnosis not present

## 2024-01-02 DIAGNOSIS — S8002XA Contusion of left knee, initial encounter: Secondary | ICD-10-CM | POA: Diagnosis not present

## 2024-01-07 DIAGNOSIS — J449 Chronic obstructive pulmonary disease, unspecified: Secondary | ICD-10-CM | POA: Diagnosis not present

## 2024-02-06 DIAGNOSIS — J449 Chronic obstructive pulmonary disease, unspecified: Secondary | ICD-10-CM | POA: Diagnosis not present

## 2024-03-04 DIAGNOSIS — I959 Hypotension, unspecified: Secondary | ICD-10-CM | POA: Diagnosis not present

## 2024-03-04 DIAGNOSIS — I700 Atherosclerosis of aorta: Secondary | ICD-10-CM | POA: Diagnosis not present

## 2024-03-04 DIAGNOSIS — R7303 Prediabetes: Secondary | ICD-10-CM | POA: Diagnosis not present

## 2024-03-04 DIAGNOSIS — E785 Hyperlipidemia, unspecified: Secondary | ICD-10-CM | POA: Diagnosis not present

## 2024-03-08 DIAGNOSIS — J449 Chronic obstructive pulmonary disease, unspecified: Secondary | ICD-10-CM | POA: Diagnosis not present

## 2024-03-11 DIAGNOSIS — Z0001 Encounter for general adult medical examination with abnormal findings: Secondary | ICD-10-CM | POA: Diagnosis not present

## 2024-03-11 DIAGNOSIS — Z0000 Encounter for general adult medical examination without abnormal findings: Secondary | ICD-10-CM | POA: Diagnosis not present

## 2024-03-11 DIAGNOSIS — E782 Mixed hyperlipidemia: Secondary | ICD-10-CM | POA: Diagnosis not present

## 2024-03-11 DIAGNOSIS — R5383 Other fatigue: Secondary | ICD-10-CM | POA: Diagnosis not present

## 2024-03-11 DIAGNOSIS — R7303 Prediabetes: Secondary | ICD-10-CM | POA: Diagnosis not present

## 2024-03-11 DIAGNOSIS — J441 Chronic obstructive pulmonary disease with (acute) exacerbation: Secondary | ICD-10-CM | POA: Diagnosis not present

## 2024-03-11 DIAGNOSIS — Z1389 Encounter for screening for other disorder: Secondary | ICD-10-CM | POA: Diagnosis not present

## 2024-04-08 DIAGNOSIS — J449 Chronic obstructive pulmonary disease, unspecified: Secondary | ICD-10-CM | POA: Diagnosis not present

## 2024-05-08 DIAGNOSIS — J449 Chronic obstructive pulmonary disease, unspecified: Secondary | ICD-10-CM | POA: Diagnosis not present

## 2024-05-13 DIAGNOSIS — Z780 Asymptomatic menopausal state: Secondary | ICD-10-CM | POA: Diagnosis not present

## 2024-05-13 DIAGNOSIS — M8589 Other specified disorders of bone density and structure, multiple sites: Secondary | ICD-10-CM | POA: Diagnosis not present

## 2024-05-13 DIAGNOSIS — M810 Age-related osteoporosis without current pathological fracture: Secondary | ICD-10-CM | POA: Diagnosis not present

## 2024-07-16 ENCOUNTER — Encounter: Payer: Self-pay | Admitting: Gastroenterology

## 2024-07-27 NOTE — H&P (Signed)
 Surgical History & Physical  Patient Name: Sherri Reed  DOB: [birth year]  Surgery: Cataract extraction with intraocular lens implant phacoemulsification; Right Eye Surgeon: Lynwood Hermann MD Surgery Date: 07/31/2024 Pre-Op Date: 06/29/2024  HPI: A 16 Yr. old female patient 1.  The patient is here for a cataract eval-OU. Pt. was referred by Dr. Shlomo. Pt. complains of difficulty when recognizing people. Both eyes are affected. The episode is constant. Distance vision is worse than near. The complaint is associated with blurry vision. This is negatively affecting the patient's quality of life and the patient is unable to function adequately in life with the current level of vision. HPI Completed by Dr. Lynwood Hermann  Medical History: Cataracts  LDL Lung Problems  Review of Systems Respiratory COPD  Social Former smoker of Cigarettes   Medication Prednisolone acetate, Ilevro, Moxifloxacin ,  Albuterol  sulfate, Atorvastatin, Azithromycin, Diclofenac sodium, Wixela Inhub, Spiriva Respimat  Sx/Procedures C Section, Tonsillectomy  Drug Allergies  Lisinopril, Raglin  History & Physical: Heent: cataract NECK: supple without bruits LUNGS: lungs clear to auscultation CV: regular rate and rhythm Abdomen: soft and non-tender  Impression & Plan: Assessment: 1.  NUCLEAR SCLEROSIS AGE RELATED; Both Eyes (H25.13) 2.  OAG BORDERLINE FINDINGS LOW RISK; Both Eyes (H40.013) 3.  DERMATOCHALASIS, no surgery; Right Upper Lid, Left Upper Lid (H02.831, H02.834) 4.  BLEPHARITIS; Right Upper Lid, Right Lower Lid, Left Upper Lid, Left Lower Lid (H01.001, H01.002,H01.004,H01.005) 5.  ASTIGMATISM, REGULAR; Both Eyes (H52.223)  Plan: 1.  Cataract accounts for the patient's decreased vision. This visual impairment is not correctable with a tolerable change in glasses or contact lenses. Cataract surgery with an implantation of a new lens should significantly improve the visual and functional status of  the patient. Recommend phacoemulsification with intraocular lens. Discussed all risks, benefits, alternatives, and potential complications. Discussed the procedures and recovery. The patient desires to have surgery. A-scan/Biometry ordered and will be performed for intraocular lens calculations. The surgery will be performed in order to improve vision for driving, reading, and for eye examinations. Recommend Dextenza  for post-operative pain and inflammation. Educational materials provided: Cataract. History of corneal refractive Surgery: None History of Previous Ocular Surgery (PPV, other): None History of ocular trauma: None Use of Eye Pressure Lowering Drops: None No current contact lens use. Pupil Status: Dilates poorly - shugarcaine or Lidocaine +Omidira by protocol, Malyugin Ring Right Eye worse. OD first, then OS. Recommend Limbal Relaxing Incision to treat astigmatism OU Discussed Panoptix Pro Lens OU  2.  Based on cup-to-disc ratio. Negative Family history. OCT rNFL shows: WNL OU IOP WNL OU. Detailed discussion about glaucoma today including importance of maintaining good follow up and following treatment plan, and the possibility of irreversible blindness as part of this disease process.  3.  Symptomatic, recommend bilateral blepharoplasty evaluation after cataract sx. Findings, prognosis and treatment options reviewed.  4.  Blepharitis is present - recommend regular lid cleaning.  5.  Explained astigmatism to patient. Recommend Limbal Relaxing Incision to treat astigmatism OU

## 2024-07-28 ENCOUNTER — Encounter (HOSPITAL_COMMUNITY)
Admission: RE | Admit: 2024-07-28 | Discharge: 2024-07-28 | Disposition: A | Discharge status: Home / Self Care | Source: Ambulatory Visit | Attending: Ophthalmology | Admitting: Ophthalmology

## 2024-07-28 ENCOUNTER — Other Ambulatory Visit: Payer: Self-pay

## 2024-07-28 ENCOUNTER — Encounter (HOSPITAL_COMMUNITY): Payer: Self-pay

## 2024-07-28 HISTORY — DX: Dependence on supplemental oxygen: Z99.81

## 2024-07-28 NOTE — Pre-Procedure Instructions (Signed)
 Pre-op phone call done. Patient is very nervous and is afraid of having a panic attack. She states, I need more than light sedation. I told her that Dr Kendell and Clare would talk to her the morning of her procedure and they could decide what they needed to do. I also placed a note on the front of her chart.

## 2024-07-31 ENCOUNTER — Encounter (HOSPITAL_COMMUNITY): Admission: RE | Disposition: A | Payer: Self-pay | Source: Home / Self Care | Attending: Ophthalmology

## 2024-07-31 ENCOUNTER — Ambulatory Visit (HOSPITAL_COMMUNITY)
Admission: RE | Admit: 2024-07-31 | Discharge: 2024-07-31 | Disposition: A | Discharge status: Home / Self Care | Attending: Ophthalmology | Admitting: Ophthalmology

## 2024-07-31 ENCOUNTER — Ambulatory Visit (HOSPITAL_COMMUNITY): Admitting: Anesthesiology

## 2024-07-31 ENCOUNTER — Encounter (HOSPITAL_COMMUNITY): Payer: Self-pay | Admitting: Ophthalmology

## 2024-07-31 DIAGNOSIS — Z9981 Dependence on supplemental oxygen: Secondary | ICD-10-CM | POA: Diagnosis not present

## 2024-07-31 DIAGNOSIS — Z87891 Personal history of nicotine dependence: Secondary | ICD-10-CM | POA: Diagnosis not present

## 2024-07-31 DIAGNOSIS — I10 Essential (primary) hypertension: Secondary | ICD-10-CM | POA: Diagnosis not present

## 2024-07-31 DIAGNOSIS — J449 Chronic obstructive pulmonary disease, unspecified: Secondary | ICD-10-CM | POA: Diagnosis not present

## 2024-07-31 DIAGNOSIS — H2511 Age-related nuclear cataract, right eye: Secondary | ICD-10-CM | POA: Diagnosis present

## 2024-07-31 HISTORY — PX: CATARACT EXTRACTION W/PHACO: SHX586

## 2024-07-31 MED ORDER — SODIUM CHLORIDE 0.9% FLUSH
INTRAVENOUS | Status: DC | PRN
  Administered 2024-07-31 (×2): 3 mL via INTRAVENOUS

## 2024-07-31 MED ORDER — TETRACAINE HCL 0.5 % OP SOLN
1.0000 [drp] | OPHTHALMIC | Status: AC | PRN
  Administered 2024-07-31 (×3): 1 [drp] via OPHTHALMIC

## 2024-07-31 MED ORDER — POVIDONE-IODINE 5 % OP SOLN
OPHTHALMIC | Status: DC | PRN
  Administered 2024-07-31: 1 via OPHTHALMIC

## 2024-07-31 MED ORDER — STERILE WATER FOR IRRIGATION IR SOLN
Status: DC | PRN
  Administered 2024-07-31: 1

## 2024-07-31 MED ORDER — MIDAZOLAM HCL (PF) 2 MG/2ML IJ SOLN
INTRAMUSCULAR | Status: DC | PRN
  Administered 2024-07-31 (×2): 1 mg via INTRAVENOUS

## 2024-07-31 MED ORDER — MIDAZOLAM HCL 2 MG/2ML IJ SOLN
INTRAMUSCULAR | Status: AC
  Filled 2024-07-31: qty 2

## 2024-07-31 MED ORDER — SODIUM HYALURONATE 10 MG/ML IO SOLUTION
PREFILLED_SYRINGE | INTRAOCULAR | Status: DC | PRN
  Administered 2024-07-31: .85 mL via INTRAOCULAR

## 2024-07-31 MED ORDER — LIDOCAINE HCL (PF) 1 % IJ SOLN
INTRAMUSCULAR | Status: DC | PRN
  Administered 2024-07-31: 1 mL

## 2024-07-31 MED ORDER — LIDOCAINE HCL 3.5 % OP GEL
1.0000 | Freq: Once | OPHTHALMIC | Status: AC
  Administered 2024-07-31: 1 via OPHTHALMIC

## 2024-07-31 MED ORDER — BSS IO SOLN
INTRAOCULAR | Status: DC | PRN
  Administered 2024-07-31: 15 mL via INTRAOCULAR

## 2024-07-31 MED ORDER — LACTATED RINGERS IV SOLN: INTRAVENOUS | Status: DC

## 2024-07-31 MED ORDER — PHENYLEPHRINE HCL 2.5 % OP SOLN
1.0000 [drp] | OPHTHALMIC | Status: AC | PRN
  Administered 2024-07-31 (×3): 1 [drp] via OPHTHALMIC

## 2024-07-31 MED ORDER — MOXIFLOXACIN HCL 5 MG/ML IO SOLN
INTRAOCULAR | Status: DC | PRN
  Administered 2024-07-31: .2 mL via INTRACAMERAL

## 2024-07-31 MED ORDER — SODIUM HYALURONATE 23MG/ML IO SOSY
PREFILLED_SYRINGE | INTRAOCULAR | Status: DC | PRN
  Administered 2024-07-31: .6 mL via INTRAOCULAR

## 2024-07-31 MED ORDER — TROPICAMIDE 1 % OP SOLN
1.0000 [drp] | OPHTHALMIC | Status: AC | PRN
  Administered 2024-07-31 (×3): 1 [drp] via OPHTHALMIC

## 2024-07-31 NOTE — Discharge Instructions (Addendum)
 Please discharge patient when stable, will follow up today with Dr. June Leap at the [facility] office immediately following discharge.  Leave shield in place until visit.  All paperwork with discharge instructions will be given at the office.  [facility] Address:  [address]  [place], [place] [address]

## 2024-07-31 NOTE — Transfer of Care (Signed)
 Immediate Anesthesia Transfer of Care Note  Patient: Sherri Reed  Procedure(s) Performed: PHACOEMULSIFICATION, CATARACT, WITH IOL INSERTION (Right: Eye)  Patient Location: Short Stay  Anesthesia Type:MAC  Level of Consciousness: awake and patient cooperative  Airway & Oxygen  Therapy: Patient Spontanous Breathing  Post-op Assessment: Report given to RN and Post -op Vital signs reviewed and stable  Post vital signs: Reviewed and stable  Last Vitals:  Vitals Value Taken Time  BP 146/76 07/31/24 09:36  Temp 36.8 C 07/31/24 09:36  Pulse 69 07/31/24 09:36  Resp 19 07/31/24 09:36  SpO2 96 % 07/31/24 09:36    Last Pain:  Vitals:   07/31/24 0936  TempSrc: Oral  PainSc: 2       Patients Stated Pain Goal: 8 (07/31/24 0936)  Complications: No notable events documented.

## 2024-07-31 NOTE — Interval H&P Note (Signed)
 History and Physical Interval Note:  07/31/2024 9:07 AM  Sherri Reed  has presented today for surgery, with the diagnosis of nuclear sclerotic cataract, right eye.  The various methods of treatment have been discussed with the patient and family. After consideration of risks, benefits and other options for treatment, the patient has consented to  Procedures with comments: PHACOEMULSIFICATION, CATARACT, WITH IOL INSERTION (Right) - CDE: as a surgical intervention.  The patient's history has been reviewed, patient examined, no change in status, stable for surgery.  I have reviewed the patient's chart and labs.  Questions were answered to the patient's satisfaction.     HARRIE AGENT

## 2024-07-31 NOTE — Anesthesia Procedure Notes (Signed)
 Date/Time: 07/31/2024 9:12 AM  Performed by: Barbarann Verneita RAMAN, CRNAPre-anesthesia Checklist: Patient identified, Emergency Drugs available, Suction available, Timeout performed and Patient being monitored Patient Re-evaluated:Patient Re-evaluated prior to induction Oxygen  Delivery Method: Nasal Cannula

## 2024-07-31 NOTE — Op Note (Signed)
.  Date of procedure: 07/31/24  Pre-operative diagnosis: Visually significant age-related nuclear cataract, Right Eye (H25.11)  Post-operative diagnosis: Visually significant age-related nuclear cataract, Right Eye  Procedure: Removal of cataract via phacoemulsification and insertion of intra-ocular lens Vicci and Johnson DIB00 +24.5D into the capsular bag of the Right Eye  Attending surgeon: Lynwood LABOR. Crytal Pensinger, MD, MA  Anesthesia: MAC, Topical Akten   Complications: None  Estimated Blood Loss: <78mL (minimal)  Specimens: None  Implants: As above  Indications:  Visually significant age-related cataract, Right Eye  Procedure:  The patient was seen and identified in the pre-operative area. The operative eye was identified and dilated.  The operative eye was marked.  Topical anesthesia was administered to the operative eye.     The patient was then to the operative suite and placed in the supine position.  A timeout was performed confirming the patient, procedure to be performed, and all other relevant information.   The patient's face was prepped and draped in the usual fashion for intra-ocular surgery.  A lid speculum was placed into the operative eye and the surgical microscope moved into place and focused.  A superotemporal paracentesis was created using a 20 gauge paracentesis blade. Omidria was injected into the anterior chamber. Shugarcaine was injected into the anterior chamber.  Viscoelastic was injected into the anterior chamber.  A temporal clear-corneal main wound incision was created using a 2.51mm microkeratome.  A continuous curvilinear capsulorrhexis was initiated using an irrigating cystitome and completed using capsulorrhexis forceps.  Hydrodissection and hydrodeliniation were performed.  Viscoelastic was injected into the anterior chamber.  A phacoemulsification handpiece and a chopper as a second instrument were used to remove the nucleus and epinucleus. The irrigation/aspiration  handpiece was used to remove any remaining cortical material.   The capsular bag was reinflated with viscoelastic, checked, and found to be intact.  The intraocular lens was inserted into the capsular bag.  The irrigation/aspiration handpiece was used to remove any remaining viscoelastic.  The clear corneal wound and paracentesis wounds were then hydrated and checked with Weck-Cels to be watertight. 0.1mL of moxifloxacin  was injected into the anterior chamber. The lid-speculum and drape was removed, and the patient's face was cleaned with a wet and dry 4x4.  A clear shield was taped over the eye. The patient was taken to the post-operative care unit in good condition, having tolerated the procedure well.  Post-Op Instructions: The patient will follow up at [facility] for a same day post-operative evaluation and will receive all other orders and instructions.

## 2024-07-31 NOTE — Anesthesia Preprocedure Evaluation (Signed)
"                                    Anesthesia Evaluation  Patient identified by MRN, date of birth, ID band Patient awake    Reviewed: Allergy & Precautions, H&P , NPO status , Patient's Chart, lab work & pertinent test results, reviewed documented beta blocker date and time   Airway Mallampati: II  TM Distance: >3 FB Neck ROM: full    Dental no notable dental hx.    Pulmonary COPD,  oxygen  dependent, former smoker   Pulmonary exam normal breath sounds clear to auscultation       Cardiovascular Exercise Tolerance: Good hypertension, negative cardio ROS  Rhythm:regular Rate:Normal     Neuro/Psych negative neurological ROS  negative psych ROS   GI/Hepatic negative GI ROS, Neg liver ROS,,,  Endo/Other  negative endocrine ROS    Renal/GU negative Renal ROS  negative genitourinary   Musculoskeletal   Abdominal   Peds  Hematology negative hematology ROS (+)   Anesthesia Other Findings   Reproductive/Obstetrics negative OB ROS                              Anesthesia Physical Anesthesia Plan  ASA: 3  Anesthesia Plan: MAC   Post-op Pain Management:    Induction:   PONV Risk Score and Plan:   Airway Management Planned:   Additional Equipment:   Intra-op Plan:   Post-operative Plan:   Informed Consent: I have reviewed the patients History and Physical, chart, labs and discussed the procedure including the risks, benefits and alternatives for the proposed anesthesia with the patient or authorized representative who has indicated his/her understanding and acceptance.     Dental Advisory Given  Plan Discussed with: CRNA  Anesthesia Plan Comments:         Anesthesia Quick Evaluation  "

## 2024-07-31 NOTE — Anesthesia Postprocedure Evaluation (Signed)
"   Anesthesia Post Note  Patient: Sherri Reed  Procedure(s) Performed: PHACOEMULSIFICATION, CATARACT, WITH IOL INSERTION (Right: Eye)  Patient location during evaluation: Phase II Anesthesia Type: MAC Level of consciousness: awake Pain management: pain level controlled Vital Signs Assessment: post-procedure vital signs reviewed and stable Respiratory status: spontaneous breathing and respiratory function stable Cardiovascular status: blood pressure returned to baseline and stable Postop Assessment: no headache and no apparent nausea or vomiting Anesthetic complications: no Comments: Late entry   No notable events documented.   Last Vitals:  Vitals:   07/31/24 0812 07/31/24 0936  BP: (!) 164/78 (!) 146/76  Pulse:  69  Resp: 18 19  Temp: 36.6 C 36.8 C  SpO2: 98% 96%    Last Pain:  Vitals:   07/31/24 0936  TempSrc: Oral  PainSc: 2                  [name]      "

## 2024-08-03 ENCOUNTER — Encounter (HOSPITAL_COMMUNITY): Payer: Self-pay | Admitting: Ophthalmology

## 2024-08-11 ENCOUNTER — Encounter (HOSPITAL_COMMUNITY): Payer: Self-pay

## 2024-08-11 NOTE — H&P (Signed)
 Surgical History & Physical  Patient Name: Sherri Reed  DOB: Aug 03, 1950  Surgery: Cataract extraction with intraocular lens implant phacoemulsification; Left Eye Surgeon: Lynwood Hermann MD Surgery Date: 08/14/2024 Pre-Op Date: 08/06/2024  HPI: A 27 Yr. old female patient 1. The patient is returning for a cataract 6 day follow-up of the right eye. Since the last visit, the affected area is doing well. The patient's vision is improved. The condition's severity is constant. Patient is following medication instructions. 2. Patient also here for blurry vision in the left eye. The vision has been blurry for several months, is constant and worsening. She is having trouble with driving and reading. Pt. is ready to proceed with cataract surgery due to blurred vision, which his is negatively affecting the patient's quality of life and the patient is unable to function adequately in life with the current level of vision. HPI Completed by Dr. Lynwood Hermann  Medical History: Cataracts  LDL Lung Problems  Review of Systems Respiratory COPD  Social Former smoker of Cigarettes   Medication Ilevro, Moxifloxacin , Prednisolone acetate,  Albuterol  sulfate, Atorvastatin, Azithromycin, Diclofenac sodium, Wixela Inhub, Spiriva Respimat  Sx/Procedures Phaco c IOL OD,  C Section, Tonsillectomy   Drug Allergies  Lisinopril, Raglin  History & Physical: Heent: cataract NECK: supple without bruits LUNGS: lungs clear to auscultation CV: regular rate and rhythm Abdomen: soft and non-tender  Impression & Plan: Assessment: 1.  CATARACT EXTRACTION STATUS; Right Eye (Z98.41) 2.  NUCLEAR SCLEROSIS AGE RELATED; , Left Eye (H25.12)  Plan: 1.  1 week after cataract surgery. Doing well with improved vision and normal eye pressure. Call with any problems or concerns. Stop Vigamox . Continue Ilevro 1 drop 1x/day for 3 more weeks. Continue Pred Acetate 1 drop 2x/day for 3 more weeks.  2.  Cataract accounts  for the patient's decreased vision. This visual impairment is not correctable with a tolerable change in glasses or contact lenses. Cataract surgery with an implantation of a new lens should significantly improve the visual and functional status of the patient. Recommend phacoemulsification with intraocular lens. Discussed all risks, benefits, alternatives, and potential complications. Discussed the procedures and recovery. The patient desires to have surgery. A-scan/Biometry ordered and will be performed for intraocular lens calculations. The surgery will be performed in order to improve vision for driving, reading, and for eye examinations. Recommend Dextenza  for post-operative pain and inflammation. Educational materials provided: Cataract. History of corneal refractive Surgery: None History of Previous Ocular Surgery (PPV, other): None History of ocular trauma: None Use of Eye Pressure Lowering Drops: None No current contact lens use. Pupil Status: Dilates poorly - shugarcaine or Lidocaine +Omidira by protocol, Malyugin Ring Left Eye.

## 2024-08-12 ENCOUNTER — Encounter (HOSPITAL_COMMUNITY)
Admission: RE | Admit: 2024-08-12 | Discharge: 2024-08-12 | Disposition: A | Discharge status: Home / Self Care | Source: Ambulatory Visit | Attending: Ophthalmology | Admitting: Ophthalmology

## 2024-08-14 ENCOUNTER — Ambulatory Visit (HOSPITAL_COMMUNITY): Admitting: Anesthesiology

## 2024-08-14 ENCOUNTER — Ambulatory Visit (HOSPITAL_COMMUNITY)
Admission: RE | Admit: 2024-08-14 | Discharge: 2024-08-14 | Disposition: A | Discharge status: Home / Self Care | Attending: Ophthalmology | Admitting: Ophthalmology

## 2024-08-14 ENCOUNTER — Encounter (HOSPITAL_COMMUNITY)
Admission: RE | Disposition: A | Discharge status: Home / Self Care | Payer: Self-pay | Source: Home / Self Care | Attending: Ophthalmology

## 2024-08-14 DIAGNOSIS — J449 Chronic obstructive pulmonary disease, unspecified: Secondary | ICD-10-CM | POA: Insufficient documentation

## 2024-08-14 DIAGNOSIS — Z9981 Dependence on supplemental oxygen: Secondary | ICD-10-CM | POA: Diagnosis not present

## 2024-08-14 DIAGNOSIS — I10 Essential (primary) hypertension: Secondary | ICD-10-CM | POA: Diagnosis not present

## 2024-08-14 DIAGNOSIS — Z9841 Cataract extraction status, right eye: Secondary | ICD-10-CM | POA: Insufficient documentation

## 2024-08-14 DIAGNOSIS — H2512 Age-related nuclear cataract, left eye: Secondary | ICD-10-CM | POA: Diagnosis present

## 2024-08-14 DIAGNOSIS — Z87891 Personal history of nicotine dependence: Secondary | ICD-10-CM | POA: Insufficient documentation

## 2024-08-14 MED ORDER — SODIUM CHLORIDE 0.9% FLUSH
INTRAVENOUS | Status: DC | PRN
  Administered 2024-08-14: 5 mL via INTRAVENOUS

## 2024-08-14 MED ORDER — MOXIFLOXACIN HCL 5 MG/ML IO SOLN
INTRAOCULAR | Status: DC | PRN
  Administered 2024-08-14: .3 mL via INTRACAMERAL

## 2024-08-14 MED ORDER — PHENYLEPHRINE-KETOROLAC 1-0.3 % IO SOLN
INTRAOCULAR | Status: DC | PRN
  Administered 2024-08-14: 500 mL via OPHTHALMIC

## 2024-08-14 MED ORDER — SODIUM HYALURONATE 10 MG/ML IO SOLUTION
PREFILLED_SYRINGE | INTRAOCULAR | Status: DC | PRN
  Administered 2024-08-14: .85 mL via INTRAOCULAR

## 2024-08-14 MED ORDER — MIDAZOLAM HCL 2 MG/2ML IJ SOLN
INTRAMUSCULAR | Status: AC
  Filled 2024-08-14: qty 2

## 2024-08-14 MED ORDER — TROPICAMIDE 1 % OP SOLN
1.0000 [drp] | OPHTHALMIC | Status: AC | PRN
  Administered 2024-08-14 (×3): 1 [drp] via OPHTHALMIC

## 2024-08-14 MED ORDER — LIDOCAINE HCL 3.5 % OP GEL
1.0000 | Freq: Once | OPHTHALMIC | Status: AC
  Administered 2024-08-14: 1 via OPHTHALMIC

## 2024-08-14 MED ORDER — BSS IO SOLN
INTRAOCULAR | Status: DC | PRN
  Administered 2024-08-14: 15 mL via INTRAOCULAR

## 2024-08-14 MED ORDER — POVIDONE-IODINE 5 % OP SOLN
OPHTHALMIC | Status: DC | PRN
  Administered 2024-08-14: 1 via OPHTHALMIC

## 2024-08-14 MED ORDER — STERILE WATER FOR IRRIGATION IR SOLN
Status: DC | PRN
  Administered 2024-08-14: 1

## 2024-08-14 MED ORDER — PHENYLEPHRINE HCL 2.5 % OP SOLN
1.0000 [drp] | OPHTHALMIC | Status: AC | PRN
  Administered 2024-08-14 (×3): 1 [drp] via OPHTHALMIC

## 2024-08-14 MED ORDER — TETRACAINE HCL 0.5 % OP SOLN
1.0000 [drp] | OPHTHALMIC | Status: AC | PRN
  Administered 2024-08-14 (×3): 1 [drp] via OPHTHALMIC

## 2024-08-14 MED ORDER — LIDOCAINE HCL (PF) 1 % IJ SOLN
INTRAMUSCULAR | Status: DC | PRN
  Administered 2024-08-14: 1 mL

## 2024-08-14 MED ORDER — MIDAZOLAM HCL (PF) 2 MG/2ML IJ SOLN
INTRAMUSCULAR | Status: DC | PRN
  Administered 2024-08-14: 1 mg via INTRAVENOUS

## 2024-08-14 MED ORDER — SODIUM HYALURONATE 23MG/ML IO SOSY
PREFILLED_SYRINGE | INTRAOCULAR | Status: DC | PRN
  Administered 2024-08-14: .6 mL via INTRAOCULAR

## 2024-08-14 NOTE — Discharge Instructions (Addendum)
 Please discharge patient when stable, will follow up today with Dr. June Leap at the [facility] office immediately following discharge.  Leave shield in place until visit.  All paperwork with discharge instructions will be given at the office.  [facility] Address:  [address]  [place], [place] [address]

## 2024-08-14 NOTE — Anesthesia Preprocedure Evaluation (Signed)
"                                    Anesthesia Evaluation  Patient identified by MRN, date of birth, ID band Patient awake    Reviewed: Allergy & Precautions, H&P , NPO status , Patient's Chart, lab work & pertinent test results, reviewed documented beta blocker date and time   Airway Mallampati: II  TM Distance: >3 FB Neck ROM: full    Dental no notable dental hx.    Pulmonary COPD,  oxygen  dependent, former smoker   Pulmonary exam normal breath sounds clear to auscultation       Cardiovascular Exercise Tolerance: Good hypertension, negative cardio ROS  Rhythm:regular Rate:Normal     Neuro/Psych negative neurological ROS  negative psych ROS   GI/Hepatic negative GI ROS, Neg liver ROS,,,  Endo/Other  negative endocrine ROS    Renal/GU negative Renal ROS  negative genitourinary   Musculoskeletal   Abdominal   Peds  Hematology negative hematology ROS (+)   Anesthesia Other Findings   Reproductive/Obstetrics negative OB ROS                              Anesthesia Physical Anesthesia Plan  ASA: 3  Anesthesia Plan: MAC   Post-op Pain Management:    Induction:   PONV Risk Score and Plan:   Airway Management Planned:   Additional Equipment:   Intra-op Plan:   Post-operative Plan:   Informed Consent: I have reviewed the patients History and Physical, chart, labs and discussed the procedure including the risks, benefits and alternatives for the proposed anesthesia with the patient or authorized representative who has indicated his/her understanding and acceptance.     Dental Advisory Given  Plan Discussed with: CRNA  Anesthesia Plan Comments:         Anesthesia Quick Evaluation  "

## 2024-08-14 NOTE — Interval H&P Note (Signed)
 History and Physical Interval Note:  08/14/2024 10:48 AM  Sherri Reed  has presented today for surgery, with the diagnosis of nuclear sclerotic cataract, left eye.  The various methods of treatment have been discussed with the patient and family. After consideration of risks, benefits and other options for treatment, the patient has consented to  Procedures: PHACOEMULSIFICATION, CATARACT, WITH IOL INSERTION (Left) as a surgical intervention.  The patient's history has been reviewed, patient examined, no change in status, stable for surgery.  I have reviewed the patient's chart and labs.  Questions were answered to the patient's satisfaction.     HARRIE AGENT

## 2024-08-14 NOTE — Op Note (Signed)
 Date of procedure: 08/14/24  Pre-operative diagnosis: Visually significant age-related nuclear cataract, Left Eye (H25.12)  Post-operative diagnosis: Visually significant age-related nuclear cataract, Left Eye  Procedure: Removal of cataract via phacoemulsification and insertion of intra-ocular lens Vicci and Johnson DIB00 +24.5D into the capsular bag of the Left Eye  Attending surgeon: Lynwood LABOR. Cydney Alvarenga, MD, MA  Anesthesia: MAC, Topical Akten   Complications: None  Estimated Blood Loss: <36mL (minimal)  Specimens: None  Implants: As above  Indications:  Visually significant age-related cataract, Left Eye  Procedure:  The patient was seen and identified in the pre-operative area. The operative eye was identified and dilated.  The operative eye was marked.  Topical anesthesia was administered to the operative eye.     The patient was then to the operative suite and placed in the supine position.  A timeout was performed confirming the patient, procedure to be performed, and all other relevant information.   The patient's face was prepped and draped in the usual fashion for intra-ocular surgery.  A lid speculum was placed into the operative eye and the surgical microscope moved into place and focused.  An inferotemporal paracentesis was created using a 20 gauge paracentesis blade. Omidria  was injected into the anterior chamber. Shugarcaine was injected into the anterior chamber.  Viscoelastic was injected into the anterior chamber.  A temporal clear-corneal main wound incision was created using a 2.31mm microkeratome.  A continuous curvilinear capsulorrhexis was initiated using an irrigating cystitome and completed using capsulorrhexis forceps.  Hydrodissection and hydrodeliniation were performed.  Viscoelastic was injected into the anterior chamber.  A phacoemulsification handpiece and a chopper as a second instrument were used to remove the nucleus and epinucleus. The irrigation/aspiration  handpiece was used to remove any remaining cortical material.   The capsular bag was reinflated with viscoelastic, checked, and found to be intact.  The intraocular lens was inserted into the capsular bag.  The irrigation/aspiration handpiece was used to remove any remaining viscoelastic.  The clear corneal wound and paracentesis wounds were then hydrated and checked with Weck-Cels to be watertight. 0.1mL of moxifloxacin  was injected into the anterior chamber. The lid-speculum and drape was removed, and the patient's face was cleaned with a wet and dry 4x4. A clear shield was taped over the eye. The patient was taken to the post-operative care unit in good condition, having tolerated the procedure well.  Post-Op Instructions: The patient will follow up at [facility] for a same day post-operative evaluation and will receive all other orders and instructions.

## 2024-08-14 NOTE — Transfer of Care (Signed)
 Immediate Anesthesia Transfer of Care Note  Patient: Sherri Reed  Procedure(s) Performed: PHACOEMULSIFICATION, CATARACT, WITH IOL INSERTION (Left: Eye)  Patient Location: Short Stay  Anesthesia Type:MAC  Level of Consciousness: awake, alert , oriented, and patient cooperative  Airway & Oxygen  Therapy: Patient Spontanous Breathing  Post-op Assessment: Report given to RN and Post -op Vital signs reviewed and stable  Post vital signs: Reviewed and stable  Last Vitals:  Vitals Value Taken Time  BP 137/74 08/14/24 11:18  Temp    Pulse 72 08/14/24 11:18  Resp 12 08/14/24 11:18  SpO2 98% on RA 08/14/24 11:18    Last Pain:  Vitals:   08/14/24 1000  TempSrc: Oral  PainSc: 0-No pain         Complications: No notable events documented.

## 2024-08-15 NOTE — Anesthesia Postprocedure Evaluation (Signed)
"   Anesthesia Post Note  Patient: Sherri Reed  Procedure(s) Performed: PHACOEMULSIFICATION, CATARACT, WITH IOL INSERTION (Left: Eye)  Patient location during evaluation: Phase II Anesthesia Type: MAC Level of consciousness: awake Pain management: pain level controlled Vital Signs Assessment: post-procedure vital signs reviewed and stable Respiratory status: spontaneous breathing and respiratory function stable Cardiovascular status: blood pressure returned to baseline and stable Postop Assessment: no headache and no apparent nausea or vomiting Anesthetic complications: no Comments: Late entry   No notable events documented.   Last Vitals:  Vitals:   08/14/24 1000 08/14/24 1118  BP: 128/64 137/74  Pulse: 70 73  Resp: 14 15  Temp: 36.8 C 36.7 C  SpO2: 94% 97%    Last Pain:  Vitals:   08/14/24 1118  TempSrc: Oral  PainSc: 0-No pain                 [name] Kahleel Fadeley      "

## 2024-08-16 ENCOUNTER — Encounter (HOSPITAL_COMMUNITY): Payer: Self-pay | Admitting: Ophthalmology
